# Patient Record
Sex: Female | Born: 1965 | Race: White | Hispanic: No | Marital: Married | Smoking: Former smoker
Health system: Southern US, Community
[De-identification: ages and names within clinical notes are randomized; demographics above are authoritative.]

## PROBLEM LIST (undated history)

## (undated) DIAGNOSIS — N898 Other specified noninflammatory disorders of vagina: Secondary | ICD-10-CM

## (undated) DIAGNOSIS — D649 Anemia, unspecified: Secondary | ICD-10-CM

## (undated) DIAGNOSIS — R5383 Other fatigue: Secondary | ICD-10-CM

## (undated) DIAGNOSIS — N920 Excessive and frequent menstruation with regular cycle: Secondary | ICD-10-CM

## (undated) DIAGNOSIS — N943 Premenstrual tension syndrome: Secondary | ICD-10-CM

## (undated) HISTORY — DX: Other fatigue: R53.83

## (undated) HISTORY — DX: Excessive and frequent menstruation with regular cycle: N92.0

## (undated) HISTORY — DX: Anemia, unspecified: D64.9

## (undated) HISTORY — DX: Other specified noninflammatory disorders of vagina: N89.8

## (undated) HISTORY — PX: OTHER SURGICAL HISTORY: SHX169

## (undated) HISTORY — PX: TONSILLECTOMY AND ADENOIDECTOMY: SHX28

## (undated) HISTORY — DX: Premenstrual tension syndrome: N94.3

---

## 2008-02-05 ENCOUNTER — Other Ambulatory Visit: Admission: RE | Admit: 2008-02-05 | Discharge: 2008-02-05 | Payer: Self-pay | Admitting: Obstetrics and Gynecology

## 2008-02-06 ENCOUNTER — Ambulatory Visit (HOSPITAL_COMMUNITY): Admission: RE | Admit: 2008-02-06 | Discharge: 2008-02-06 | Payer: Self-pay | Admitting: Obstetrics & Gynecology

## 2009-02-17 ENCOUNTER — Other Ambulatory Visit: Admission: RE | Admit: 2009-02-17 | Discharge: 2009-02-17 | Payer: Self-pay | Admitting: Obstetrics and Gynecology

## 2010-02-27 ENCOUNTER — Ambulatory Visit (HOSPITAL_COMMUNITY): Admission: RE | Admit: 2010-02-27 | Discharge: 2010-02-27 | Payer: Self-pay | Admitting: Obstetrics & Gynecology

## 2011-02-15 ENCOUNTER — Other Ambulatory Visit: Payer: Self-pay | Admitting: Obstetrics and Gynecology

## 2011-02-15 DIAGNOSIS — Z139 Encounter for screening, unspecified: Secondary | ICD-10-CM

## 2011-03-05 ENCOUNTER — Ambulatory Visit (HOSPITAL_COMMUNITY)
Admission: RE | Admit: 2011-03-05 | Discharge: 2011-03-05 | Disposition: A | Discharge status: Home / Self Care | Payer: PRIVATE HEALTH INSURANCE | Source: Ambulatory Visit | Attending: Obstetrics and Gynecology | Admitting: Obstetrics and Gynecology

## 2011-03-05 DIAGNOSIS — Z139 Encounter for screening, unspecified: Secondary | ICD-10-CM

## 2011-03-05 DIAGNOSIS — Z1231 Encounter for screening mammogram for malignant neoplasm of breast: Secondary | ICD-10-CM | POA: Insufficient documentation

## 2011-05-10 ENCOUNTER — Other Ambulatory Visit (HOSPITAL_COMMUNITY)
Admission: RE | Admit: 2011-05-10 | Discharge: 2011-05-10 | Disposition: A | Discharge status: Home / Self Care | Payer: PRIVATE HEALTH INSURANCE | Source: Ambulatory Visit | Attending: Obstetrics and Gynecology | Admitting: Obstetrics and Gynecology

## 2011-05-10 DIAGNOSIS — Z01419 Encounter for gynecological examination (general) (routine) without abnormal findings: Secondary | ICD-10-CM | POA: Insufficient documentation

## 2012-01-28 ENCOUNTER — Other Ambulatory Visit: Payer: Self-pay | Admitting: Obstetrics and Gynecology

## 2012-03-26 ENCOUNTER — Other Ambulatory Visit: Payer: Self-pay | Admitting: Adult Health

## 2012-03-26 DIAGNOSIS — Z139 Encounter for screening, unspecified: Secondary | ICD-10-CM

## 2012-03-31 ENCOUNTER — Ambulatory Visit (HOSPITAL_COMMUNITY)
Admission: RE | Admit: 2012-03-31 | Discharge: 2012-03-31 | Disposition: A | Discharge status: Home / Self Care | Payer: PRIVATE HEALTH INSURANCE | Source: Ambulatory Visit | Attending: Adult Health | Admitting: Adult Health

## 2012-03-31 DIAGNOSIS — Z1231 Encounter for screening mammogram for malignant neoplasm of breast: Secondary | ICD-10-CM | POA: Insufficient documentation

## 2012-03-31 DIAGNOSIS — Z139 Encounter for screening, unspecified: Secondary | ICD-10-CM

## 2012-05-26 ENCOUNTER — Other Ambulatory Visit (HOSPITAL_COMMUNITY)
Admission: RE | Admit: 2012-05-26 | Discharge: 2012-05-26 | Disposition: A | Discharge status: Home / Self Care | Payer: PRIVATE HEALTH INSURANCE | Source: Ambulatory Visit | Attending: Obstetrics and Gynecology | Admitting: Obstetrics and Gynecology

## 2012-05-26 DIAGNOSIS — Z01419 Encounter for gynecological examination (general) (routine) without abnormal findings: Secondary | ICD-10-CM | POA: Insufficient documentation

## 2012-05-26 DIAGNOSIS — Z1151 Encounter for screening for human papillomavirus (HPV): Secondary | ICD-10-CM | POA: Insufficient documentation

## 2013-06-01 ENCOUNTER — Encounter: Payer: Self-pay | Admitting: Adult Health

## 2013-06-01 ENCOUNTER — Ambulatory Visit (INDEPENDENT_AMBULATORY_CARE_PROVIDER_SITE_OTHER): Payer: BC Managed Care – PPO | Admitting: Adult Health

## 2013-06-01 VITALS — BP 110/66 | HR 74 | Ht 64.5 in | Wt 137.0 lb

## 2013-06-01 DIAGNOSIS — R5383 Other fatigue: Secondary | ICD-10-CM

## 2013-06-01 DIAGNOSIS — Z1212 Encounter for screening for malignant neoplasm of rectum: Secondary | ICD-10-CM

## 2013-06-01 DIAGNOSIS — N943 Premenstrual tension syndrome: Secondary | ICD-10-CM

## 2013-06-01 DIAGNOSIS — Z01419 Encounter for gynecological examination (general) (routine) without abnormal findings: Secondary | ICD-10-CM

## 2013-06-01 DIAGNOSIS — N898 Other specified noninflammatory disorders of vagina: Secondary | ICD-10-CM

## 2013-06-01 HISTORY — DX: Other fatigue: R53.83

## 2013-06-01 HISTORY — DX: Other specified noninflammatory disorders of vagina: N89.8

## 2013-06-01 HISTORY — DX: Premenstrual tension syndrome: N94.3

## 2013-06-01 LAB — CBC
HCT: 35.6 % — ABNORMAL LOW (ref 36.0–46.0)
Hemoglobin: 12.4 g/dL (ref 12.0–15.0)
MCH: 29.2 pg (ref 26.0–34.0)
MCHC: 34.8 g/dL (ref 30.0–36.0)
MCV: 83.8 fL (ref 78.0–100.0)
Platelets: 229 10*3/uL (ref 150–400)
RBC: 4.25 MIL/uL (ref 3.87–5.11)
RDW: 17.9 % — ABNORMAL HIGH (ref 11.5–15.5)
WBC: 7.6 10*3/uL (ref 4.0–10.5)

## 2013-06-01 LAB — HEMOCCULT GUIAC POC 1CARD (OFFICE): Fecal Occult Blood, POC: NEGATIVE

## 2013-06-01 LAB — POCT WET PREP (WET MOUNT): WBC, Wet Prep HPF POC: POSITIVE

## 2013-06-01 MED ORDER — FLUOXETINE HCL 20 MG PO TABS: 20.0000 mg | ORAL_TABLET | Freq: Every day | ORAL | Status: DC

## 2013-06-01 MED ORDER — NITROFURANTOIN MONOHYD MACRO 100 MG PO CAPS: 100.0000 mg | ORAL_CAPSULE | ORAL | Status: DC

## 2013-06-01 NOTE — Progress Notes (Signed)
Patient ID: Vanessa Beasley, female   DOB: July 17, 1966, 47 y.o.   MRN: [id number] History of Present Illness: Vanessa Beasley is a 47 year old white female married in for a physical and complaining of a vaginal discharge.No itching or burning, she said she is not so sure that husband may have cheated.She is complaining of being tired. Had pap last year.  Current Medications, Allergies, Past Medical History, Past Surgical History, Family History and Social History were reviewed in [employer] record.     Review of Systems: Patient denies any headaches, blurred vision, shortness of breath, chest pain, abdominal pain, problems with bowel movements, urination, or intercourse. No joint pain or mood swings, periods are lighter, no hot flashes or nite sweats.She takes Prozac for PMS and it works well.    Physical Exam:BP 110/66  Pulse 74  Ht 5' 4.5" (1.638 m)  Wt 137 lb (62.143 kg)  BMI 23.16 kg/m2  LMP 05/28/2013 General:  Well developed, well nourished, no acute distress Skin:  Warm and dry Neck:  Midline trachea, normal thyroid Lungs; Clear to auscultation bilaterally Breast:  No dominant palpable mass, retraction, or nipple discharge Cardiovascular: Regular rate and rhythm Abdomen:  Soft, non tender, no hepatosplenomegaly Pelvic:  External genitalia is normal in appearance.  The vagina is normal in appearance, light blood in vault.  The cervix is bulbous.  Uterus is felt to be normal size, shape, and contour.  No                adnexal masses or tenderness noted.wet prep was positive WBCs and GC/CHL obtained. Rectal: Good sphincter tone, no polyps, or hemorrhoids felt.  Hemoccult negative. Extremities:  No swelling or varicosities noted Psych:  Alert and cooperative   Impression: Yearly exam-no pap Vaginal discharge PMS Fatigue     Plan: Check CBC,CMP,TSH and lipids GC/CHL sent Refilled prozac 20 mg #90 1 daily with 4 refills Refilled macrobid 100 mg 1 after sex #30  with 3 refills Physical in 1 year Mammogram yearly

## 2013-06-01 NOTE — Patient Instructions (Addendum)
Mammogram yearly Physical in 1 year Follow labs in 48 hours

## 2013-06-02 ENCOUNTER — Other Ambulatory Visit: Payer: Self-pay | Admitting: Adult Health

## 2013-06-02 DIAGNOSIS — Z139 Encounter for screening, unspecified: Secondary | ICD-10-CM

## 2013-06-02 LAB — COMPREHENSIVE METABOLIC PANEL
ALT: 13 U/L (ref 0–35)
AST: 16 U/L (ref 0–37)
Albumin: 4.4 g/dL (ref 3.5–5.2)
Alkaline Phosphatase: 88 U/L (ref 39–117)
BUN: 6 mg/dL (ref 6–23)
CO2: 27 mEq/L (ref 19–32)
Calcium: 9.6 mg/dL (ref 8.4–10.5)
Chloride: 103 mEq/L (ref 96–112)
Creat: 0.68 mg/dL (ref 0.50–1.10)
Glucose, Bld: 85 mg/dL (ref 70–99)
Potassium: 4.4 mEq/L (ref 3.5–5.3)
Sodium: 139 mEq/L (ref 135–145)
Total Bilirubin: 0.4 mg/dL (ref 0.3–1.2)
Total Protein: 7.6 g/dL (ref 6.0–8.3)

## 2013-06-02 LAB — LIPID PANEL
Cholesterol: 162 mg/dL (ref 0–200)
HDL: 40 mg/dL (ref >39)
LDL Cholesterol: 88 mg/dL (ref 0–99)
Total CHOL/HDL Ratio: 4.1 Ratio
Triglycerides: 171 mg/dL — ABNORMAL HIGH (ref <150)
VLDL: 34 mg/dL (ref 0–40)

## 2013-06-02 LAB — TSH: TSH: 2.584 u[IU]/mL (ref 0.350–4.500)

## 2013-06-02 LAB — GC/CHLAMYDIA PROBE AMP
CT Probe RNA: NEGATIVE
GC Probe RNA: NEGATIVE

## 2013-06-03 ENCOUNTER — Telehealth: Payer: Self-pay | Admitting: Adult Health

## 2013-06-03 NOTE — Telephone Encounter (Signed)
Left message regarding labs and ned to increase activity and take krell oil

## 2013-06-04 ENCOUNTER — Ambulatory Visit (HOSPITAL_COMMUNITY)
Admission: RE | Admit: 2013-06-04 | Discharge: 2013-06-04 | Disposition: A | Discharge status: Home / Self Care | Payer: BC Managed Care – PPO | Source: Ambulatory Visit | Attending: Adult Health | Admitting: Adult Health

## 2013-06-04 DIAGNOSIS — Z139 Encounter for screening, unspecified: Secondary | ICD-10-CM

## 2013-06-04 DIAGNOSIS — Z1231 Encounter for screening mammogram for malignant neoplasm of breast: Secondary | ICD-10-CM | POA: Insufficient documentation

## 2013-08-27 ENCOUNTER — Other Ambulatory Visit: Payer: Self-pay

## 2014-06-09 ENCOUNTER — Ambulatory Visit (INDEPENDENT_AMBULATORY_CARE_PROVIDER_SITE_OTHER): Payer: BC Managed Care – PPO | Admitting: Adult Health

## 2014-06-09 ENCOUNTER — Other Ambulatory Visit: Payer: Self-pay | Admitting: Adult Health

## 2014-06-09 ENCOUNTER — Encounter: Payer: Self-pay | Admitting: Adult Health

## 2014-06-09 VITALS — BP 110/68 | HR 74 | Ht 65.0 in | Wt 137.0 lb

## 2014-06-09 DIAGNOSIS — Z1231 Encounter for screening mammogram for malignant neoplasm of breast: Secondary | ICD-10-CM

## 2014-06-09 DIAGNOSIS — Z1212 Encounter for screening for malignant neoplasm of rectum: Secondary | ICD-10-CM

## 2014-06-09 DIAGNOSIS — N943 Premenstrual tension syndrome: Secondary | ICD-10-CM

## 2014-06-09 DIAGNOSIS — Z01419 Encounter for gynecological examination (general) (routine) without abnormal findings: Secondary | ICD-10-CM

## 2014-06-09 LAB — CBC
HCT: 27.7 % — ABNORMAL LOW (ref 36.0–46.0)
Hemoglobin: 8.9 g/dL — ABNORMAL LOW (ref 12.0–15.0)
MCH: 22.8 pg — ABNORMAL LOW (ref 26.0–34.0)
MCHC: 32.1 g/dL (ref 30.0–36.0)
MCV: 71 fL — ABNORMAL LOW (ref 78.0–100.0)
Platelets: 255 10*3/uL (ref 150–400)
RBC: 3.9 MIL/uL (ref 3.87–5.11)
RDW: 16.6 % — ABNORMAL HIGH (ref 11.5–15.5)
WBC: 5.3 10*3/uL (ref 4.0–10.5)

## 2014-06-09 LAB — HEMOCCULT GUIAC POC 1CARD (OFFICE): Fecal Occult Blood, POC: NEGATIVE

## 2014-06-09 MED ORDER — FLUOXETINE HCL 20 MG PO TABS: 20.0000 mg | ORAL_TABLET | Freq: Every day | ORAL | Status: DC

## 2014-06-09 MED ORDER — NITROFURANTOIN MONOHYD MACRO 100 MG PO CAPS: 100.0000 mg | ORAL_CAPSULE | ORAL | Status: DC

## 2014-06-09 NOTE — Patient Instructions (Signed)
Physical in 1 year with pap Mammogram yearly  Colonoscopy at 51  Aqua glycolic for face

## 2014-06-09 NOTE — Progress Notes (Signed)
Patient ID: Laureen Frederic, female   DOB: 1966/02/23, 48 y.o.   MRN: [id number] History of Present Illness: Poet is a 48 year old white female, in for a physical.She had a normal pap with negative HPV 05/26/12. No complaints.  Current Medications, Allergies, Past Medical History, Past Surgical History, Family History and Social History were reviewed in [employer] record.     Review of Systems: Patient denies any headaches, blurred vision, shortness of breath, chest pain, abdominal pain, problems with bowel movements, urination, or intercourse. No joint pain or mood swings.Did see some blood after hard BM, none now.Her prozac is working for her PMS and the Shaniko after sex is working well too.Did mention clogged pores on face at times.Periods regular, no hot flashes yet, we did talk about peri menopause symptoms and irregular periods.     Physical Exam:BP 110/68  Pulse 74  Ht 5\' 5"  (1.651 m)  Wt 137 lb (62.143 kg)  BMI 22.80 kg/m2  LMP 05/31/2014 General:  Well developed, well nourished, no acute distress Skin:  Warm and dry Neck:  Midline trachea, normal thyroid Lungs; Clear to auscultation bilaterally Breast:  No dominant palpable mass, retraction, or nipple discharge Cardiovascular: Regular rate and rhythm Abdomen:  Soft, non tender, no hepatosplenomegaly Pelvic:  External genitalia is normal in appearance.  The vagina is normal in appearance.  The cervix is bulbous.  Uterus is felt to be normal size, shape, and contour.  No  adnexal masses or tenderness noted. Rectal: Good sphincter tone, no polyps, or hemorrhoids felt.  Hemoccult negative. Extremities:  No swelling or varicosities noted Psych:  No mood changes, alert and cooperative,seems happy   Impression: Yearly gyn exam no pap PMS   Plan: Physical and pap in 1 year Mammogram yearly  Colonoscopy at 51 Refilled prozac 20 mg #90 1 daily with 4 refills Refilled macrobid #30 1 after sex prn with 3  refills Try aqua glycolic for face Check CBC,CMP,TSH and lipids Try prune juice daily for softer stools and increase water

## 2014-06-10 ENCOUNTER — Telehealth: Payer: Self-pay | Admitting: Adult Health

## 2014-06-10 ENCOUNTER — Ambulatory Visit (HOSPITAL_COMMUNITY)
Admission: RE | Admit: 2014-06-10 | Discharge: 2014-06-10 | Disposition: A | Discharge status: Home / Self Care | Payer: BC Managed Care – PPO | Source: Ambulatory Visit | Attending: Adult Health | Admitting: Adult Health

## 2014-06-10 DIAGNOSIS — Z1231 Encounter for screening mammogram for malignant neoplasm of breast: Secondary | ICD-10-CM | POA: Insufficient documentation

## 2014-06-10 LAB — COMPREHENSIVE METABOLIC PANEL
ALT: 10 U/L (ref 0–35)
AST: 14 U/L (ref 0–37)
Albumin: 4.2 g/dL (ref 3.5–5.2)
Alkaline Phosphatase: 89 U/L (ref 39–117)
BUN: 10 mg/dL (ref 6–23)
CO2: 26 mEq/L (ref 19–32)
Calcium: 8.8 mg/dL (ref 8.4–10.5)
Chloride: 105 mEq/L (ref 96–112)
Creat: 0.71 mg/dL (ref 0.50–1.10)
Glucose, Bld: 94 mg/dL (ref 70–99)
Potassium: 4.5 mEq/L (ref 3.5–5.3)
Sodium: 138 mEq/L (ref 135–145)
Total Bilirubin: 0.4 mg/dL (ref 0.2–1.2)
Total Protein: 7.6 g/dL (ref 6.0–8.3)

## 2014-06-10 LAB — LIPID PANEL
Cholesterol: 172 mg/dL (ref 0–200)
HDL: 45 mg/dL (ref >39)
LDL Cholesterol: 97 mg/dL (ref 0–99)
Total CHOL/HDL Ratio: 3.8 Ratio
Triglycerides: 149 mg/dL (ref <150)
VLDL: 30 mg/dL (ref 0–40)

## 2014-06-10 LAB — TSH: TSH: 1.893 u[IU]/mL (ref 0.350–4.500)

## 2014-06-10 NOTE — Telephone Encounter (Signed)
Pt aware of labs and that she is anemic,hgb 8.9, take MV with fe and OTC iron 325 mg, has had some heavy periods maybe 3 out of 5, will get [place] in office and see me, if US WNL, may try megace to stop periods and recheck CBC, if anemia persists, will get GI consult.

## 2014-06-18 ENCOUNTER — Other Ambulatory Visit: Payer: Self-pay | Admitting: Adult Health

## 2014-06-18 DIAGNOSIS — N920 Excessive and frequent menstruation with regular cycle: Secondary | ICD-10-CM

## 2014-06-23 ENCOUNTER — Ambulatory Visit (INDEPENDENT_AMBULATORY_CARE_PROVIDER_SITE_OTHER): Payer: BC Managed Care – PPO

## 2014-06-23 ENCOUNTER — Ambulatory Visit (INDEPENDENT_AMBULATORY_CARE_PROVIDER_SITE_OTHER): Payer: BC Managed Care – PPO | Admitting: Adult Health

## 2014-06-23 ENCOUNTER — Encounter: Payer: Self-pay | Admitting: Adult Health

## 2014-06-23 VITALS — BP 100/68 | Ht 65.0 in | Wt 141.0 lb

## 2014-06-23 DIAGNOSIS — D509 Iron deficiency anemia, unspecified: Secondary | ICD-10-CM

## 2014-06-23 DIAGNOSIS — N920 Excessive and frequent menstruation with regular cycle: Secondary | ICD-10-CM

## 2014-06-23 DIAGNOSIS — D649 Anemia, unspecified: Secondary | ICD-10-CM

## 2014-06-23 HISTORY — DX: Excessive and frequent menstruation with regular cycle: N92.0

## 2014-06-23 HISTORY — DX: Anemia, unspecified: D64.9

## 2014-06-23 MED ORDER — MEGESTROL ACETATE 40 MG PO TABS: ORAL_TABLET | ORAL | Status: DC

## 2014-06-23 NOTE — Patient Instructions (Signed)

## 2014-06-23 NOTE — Progress Notes (Signed)
Subjective:     Patient ID: Vanessa Beasley, female   DOB: 18-Dec-1965, 48 y.o.   MRN: [id number]  HPI Vanessa Beasley is a 48 year old white female in for [place] for menorrhagia and anemia.  Review of Systems See HPI Reviewed past medical,surgical, social and family history. Reviewed medications and allergies.     Objective:   Physical Exam BP 100/68  Ht 5\' 5"  (1.651 m)  Wt 141 lb (63.957 kg)  BMI 23.46 kg/m2  LMP 08/10/2015Reviewed [place] with pt.   Uterus 10.3 x 7.1 x 5.8 cm, anteverted no myometrial masses noted  Endometrium 13.7 mm, symmetrical,  Right ovary 2.8 x 1.9 x 1.9 cm,  Left ovary 3.4 x 2.3 x 2.0 cm,  No free fluid or adnexal masses noted within the pelvis  Technician Comments:  Anteverted uterus, Endom-13.66mm, bilateral adnexa/ovaries appears WNL, no free fluid or adnexal masses noted within the pelvis, discussed with Dr Glo Herring.Discussed megace and ablation with pt. Will try megace to stop periods and recheck CBC in 6 weeks   Assessment:     Menorrhagia Anemia    Plan:     Rx megace 40 mg #45 3 x 5 days then 2 x 5 days then 1 daily with 1 refill Continue Iron tablets Review handout on menorrhagia and endo ablation Follow up in 6 weeks for ros and check CBC

## 2014-08-04 ENCOUNTER — Encounter: Payer: Self-pay | Admitting: Adult Health

## 2014-08-04 ENCOUNTER — Ambulatory Visit (INDEPENDENT_AMBULATORY_CARE_PROVIDER_SITE_OTHER): Payer: BC Managed Care – PPO | Admitting: Adult Health

## 2014-08-04 VITALS — BP 128/80 | Ht 65.0 in | Wt 138.5 lb

## 2014-08-04 DIAGNOSIS — N920 Excessive and frequent menstruation with regular cycle: Secondary | ICD-10-CM

## 2014-08-04 DIAGNOSIS — D509 Iron deficiency anemia, unspecified: Secondary | ICD-10-CM

## 2014-08-04 LAB — CBC
HCT: 38.5 % (ref 36.0–46.0)
Hemoglobin: 12.7 g/dL (ref 12.0–15.0)
MCH: 27.2 pg (ref 26.0–34.0)
MCHC: 33 g/dL (ref 30.0–36.0)
MCV: 82.4 fL (ref 78.0–100.0)
Platelets: 217 10*3/uL (ref 150–400)
RBC: 4.67 MIL/uL (ref 3.87–5.11)
RDW: 22.1 % — ABNORMAL HIGH (ref 11.5–15.5)
WBC: 6.5 10*3/uL (ref 4.0–10.5)

## 2014-08-04 MED ORDER — MEGESTROL ACETATE 40 MG PO TABS: ORAL_TABLET | ORAL | Status: DC

## 2014-08-04 NOTE — Progress Notes (Addendum)
Subjective:     Patient ID: Vanessa Beasley, female   DOB: 1966-09-26, 48 y.o.   MRN: [id number]  HPI Vanessa Beasley is a 48 year old white female, back in follow up of menorrhagia and anemia, she is feeling much better and bleeding had stopped but spotting for 2 weeks now,is not craving ice as was before.  Review of Systems See HPI Reviewed past medical,surgical, social and family history. Reviewed medications and allergies.     Objective:   Physical Exam BP 128/80  Ht 5\' 5"  (1.651 m)  Wt 138 lb 8 oz (62.823 kg)  BMI 23.05 kg/m2   Discussed continuing megace and if bleeding returned heavy could restart with 3 a day for 5 days and then 2 a day for 5 days then 1 daily, is not interested in ablation at this time. She got flu shot today at work.  Assessment:     Menorrhagia,resolved with megace Anemia     Plan:    CBC Continue megace refilled it x 3  Continue iron Follow up in 3 months   Call prn

## 2014-08-04 NOTE — Patient Instructions (Signed)
Continue megace  Continue iron Return in 3 months

## 2014-08-05 ENCOUNTER — Telehealth: Payer: Self-pay | Admitting: Adult Health

## 2014-08-05 NOTE — Telephone Encounter (Signed)
Pt aware of labs, feels better some night sweats

## 2014-08-23 ENCOUNTER — Encounter: Payer: Self-pay | Admitting: Adult Health

## 2014-11-05 ENCOUNTER — Ambulatory Visit (INDEPENDENT_AMBULATORY_CARE_PROVIDER_SITE_OTHER): Payer: BLUE CROSS/BLUE SHIELD | Admitting: Adult Health

## 2014-11-05 ENCOUNTER — Encounter: Payer: Self-pay | Admitting: Adult Health

## 2014-11-05 VITALS — BP 104/70 | Ht 65.0 in | Wt 144.0 lb

## 2014-11-05 DIAGNOSIS — D509 Iron deficiency anemia, unspecified: Secondary | ICD-10-CM

## 2014-11-05 DIAGNOSIS — N920 Excessive and frequent menstruation with regular cycle: Secondary | ICD-10-CM

## 2014-11-05 LAB — CBC
HCT: 38 % (ref 36.0–46.0)
Hemoglobin: 12.8 g/dL (ref 12.0–15.0)
MCH: 30.5 pg (ref 26.0–34.0)
MCHC: 33.7 g/dL (ref 30.0–36.0)
MCV: 90.7 fL (ref 78.0–100.0)
MPV: 9.3 fL (ref 8.6–12.4)
Platelets: 291 10*3/uL (ref 150–400)
RBC: 4.19 MIL/uL (ref 3.87–5.11)
RDW: 13.4 % (ref 11.5–15.5)
WBC: 5.9 10*3/uL (ref 4.0–10.5)

## 2014-11-05 NOTE — Progress Notes (Signed)
Subjective:     Patient ID: Vanessa Beasley, female   DOB: July 09, 1966, 49 y.o.   MRN: [id number]  HPI Vanessa Beasley is a 49 year old white female back in follow up of menorrhagia and anemia.Stopped megace about 2 months ago, periods heavy but taking iron and feels good.Does not want to take megace,declines IUD or ablation at this time if iron helps.  Review of Systems See HPI Reviewed past medical,surgical, social and family history. Reviewed medications and allergies.     Objective:   Physical Exam BP 104/70 mmHg  Ht 5\' 5"  (1.651 m)  Wt 144 lb (65.318 kg)  BMI 23.96 kg/m2  LMP 11/01/2014   Talk only,see HPI, will check CBC and be supportive of her.  Assessment:    Menorrhagia Anemia    Plan:     Continue iron Check CBC Follow up in 3 months

## 2014-11-05 NOTE — Patient Instructions (Signed)
Continue iron  Follow up in 3 months

## 2014-11-08 ENCOUNTER — Telehealth: Payer: Self-pay | Admitting: Adult Health

## 2014-11-08 NOTE — Telephone Encounter (Signed)
Pt aware HGB 12.8 will follow for now, keep taking iron

## 2015-02-09 ENCOUNTER — Ambulatory Visit: Payer: BLUE CROSS/BLUE SHIELD | Admitting: Adult Health

## 2015-06-10 ENCOUNTER — Encounter: Payer: Self-pay | Admitting: Adult Health

## 2015-06-10 ENCOUNTER — Other Ambulatory Visit (HOSPITAL_COMMUNITY)
Admission: RE | Admit: 2015-06-10 | Discharge: 2015-06-10 | Disposition: A | Discharge status: Home / Self Care | Payer: BLUE CROSS/BLUE SHIELD | Source: Ambulatory Visit | Attending: Adult Health | Admitting: Adult Health

## 2015-06-10 ENCOUNTER — Ambulatory Visit (INDEPENDENT_AMBULATORY_CARE_PROVIDER_SITE_OTHER): Payer: BLUE CROSS/BLUE SHIELD | Admitting: Adult Health

## 2015-06-10 VITALS — BP 110/80 | HR 64 | Ht 64.5 in | Wt 147.0 lb

## 2015-06-10 DIAGNOSIS — Z1151 Encounter for screening for human papillomavirus (HPV): Secondary | ICD-10-CM | POA: Insufficient documentation

## 2015-06-10 DIAGNOSIS — Z124 Encounter for screening for malignant neoplasm of cervix: Secondary | ICD-10-CM | POA: Diagnosis not present

## 2015-06-10 DIAGNOSIS — Z01419 Encounter for gynecological examination (general) (routine) without abnormal findings: Secondary | ICD-10-CM

## 2015-06-10 DIAGNOSIS — N943 Premenstrual tension syndrome: Secondary | ICD-10-CM

## 2015-06-10 DIAGNOSIS — N924 Excessive bleeding in the premenopausal period: Secondary | ICD-10-CM

## 2015-06-10 LAB — HEMOCCULT GUIAC POC 1CARD (OFFICE): Fecal Occult Blood, POC: NEGATIVE

## 2015-06-10 MED ORDER — FLUOXETINE HCL 20 MG PO TABS: 20.0000 mg | ORAL_TABLET | Freq: Every day | ORAL | Status: DC

## 2015-06-10 MED ORDER — NITROFURANTOIN MONOHYD MACRO 100 MG PO CAPS: 100.0000 mg | ORAL_CAPSULE | ORAL | Status: DC

## 2015-06-10 NOTE — Progress Notes (Signed)
Patient ID: Vanessa Beasley, female   DOB: 06-Aug-1966, 49 y.o.   MRN: [id number] History of Present Illness: Vanessa Beasley is a 49 year old white female, married, in for well woman gyn exam and pap.   Current Medications, Allergies, Past Medical History, Past Surgical History, Family History and Social History were reviewed in [employer] record.     Review of Systems: Patient denies any headaches, hearing loss, fatigue, blurred vision, shortness of breath, chest pain, abdominal pain, problems with bowel movements, urination, or intercourse. No joint pain or mood swings.She is still having heavy periods, and they are regular, but wants to hold out for menopause,declines megace at present or surgical intervention, she is taking iron and prozac has helped with PMS and taking macrobid after sex, has kept her from getting UTIs.    Physical Exam:BP 110/80 mmHg  Pulse 64  Ht 5' 4.5" (1.638 m)  Wt 147 lb (66.679 kg)  BMI 24.85 kg/m2  LMP 05/30/2015 General:  Well developed, well nourished, no acute distress Skin:  Warm and dry Neck:  Midline trachea, normal thyroid, good ROM, no lymphadenopathy Lungs; Clear to auscultation bilaterally Breast:  No dominant palpable mass, retraction, or nipple discharge Cardiovascular: Regular rate and rhythm Abdomen:  Soft, non tender, no hepatosplenomegaly Pelvic:  External genitalia is normal in appearance, no lesions.  The vagina is normal in appearance. Urethra has no lesions or masses. The cervix is bulbous.Pap with HPV performed.  Uterus is felt to be normal size, shape, and contour.  No adnexal masses or tenderness noted.Bladder is non tender, no masses felt. Rectal: Good sphincter tone, no polyps, or hemorrhoids felt.  Hemoccult negative. Extremities/musculoskeletal:  No swelling or varicosities noted, no clubbing or cyanosis Psych:  No mood changes, alert and cooperative,seems happy   Impression:  Well woman gyn exam with pap Menorrhagia   PMS   Plan: Check CBC,CMP,TSH and lipids Get mammogram now and yearly Physical in 1 year,pap in 3 if normal with negative HPV Refilled macro bid #30 1 after sex with 3 refills Refilled prozac 20 mg #90 1 daily with 3 refills Continue Iron, will talk when labs back

## 2015-06-10 NOTE — Patient Instructions (Signed)
Get mammogram  Physical in 1 year  

## 2015-06-11 LAB — TSH: TSH: 3.15 u[IU]/mL (ref 0.450–4.500)

## 2015-06-11 LAB — COMPREHENSIVE METABOLIC PANEL
ALT: 32 IU/L (ref 0–32)
AST: 24 IU/L (ref 0–40)
Albumin/Globulin Ratio: 1.4 (ref 1.1–2.5)
Albumin: 4.2 g/dL (ref 3.5–5.5)
Alkaline Phosphatase: 91 IU/L (ref 39–117)
BUN/Creatinine Ratio: 11 (ref 9–23)
BUN: 8 mg/dL (ref 6–24)
Bilirubin Total: 0.3 mg/dL (ref 0.0–1.2)
CO2: 24 mmol/L (ref 18–29)
Calcium: 9.2 mg/dL (ref 8.7–10.2)
Chloride: 101 mmol/L (ref 97–108)
Creatinine, Ser: 0.76 mg/dL (ref 0.57–1.00)
GFR calc Af Amer: 107 mL/min/{1.73_m2} (ref >59)
GFR calc non Af Amer: 93 mL/min/{1.73_m2} (ref >59)
Globulin, Total: 3 g/dL (ref 1.5–4.5)
Glucose: 89 mg/dL (ref 65–99)
Potassium: 4.5 mmol/L (ref 3.5–5.2)
Sodium: 138 mmol/L (ref 134–144)
Total Protein: 7.2 g/dL (ref 6.0–8.5)

## 2015-06-11 LAB — CBC
Hematocrit: 40.1 % (ref 34.0–46.6)
Hemoglobin: 13.2 g/dL (ref 11.1–15.9)
MCH: 30 pg (ref 26.6–33.0)
MCHC: 32.9 g/dL (ref 31.5–35.7)
MCV: 91 fL (ref 79–97)
Platelets: 245 10*3/uL (ref 150–379)
RBC: 4.4 x10E6/uL (ref 3.77–5.28)
RDW: 13.3 % (ref 12.3–15.4)
WBC: 7.7 10*3/uL (ref 3.4–10.8)

## 2015-06-11 LAB — LIPID PANEL
Chol/HDL Ratio: 5.1 ratio units — ABNORMAL HIGH (ref 0.0–4.4)
Cholesterol, Total: 209 mg/dL — ABNORMAL HIGH (ref 100–199)
HDL: 41 mg/dL (ref >39)
LDL Calculated: 115 mg/dL — ABNORMAL HIGH (ref 0–99)
Triglycerides: 267 mg/dL — ABNORMAL HIGH (ref 0–149)
VLDL Cholesterol Cal: 53 mg/dL — ABNORMAL HIGH (ref 5–40)

## 2015-06-13 ENCOUNTER — Telehealth: Payer: Self-pay | Admitting: Adult Health

## 2015-06-13 LAB — CYTOLOGY - PAP

## 2015-06-13 NOTE — Telephone Encounter (Signed)
Pt aware of labs and need to increase exercise and watch diet and will recheck labs next year

## 2015-06-24 ENCOUNTER — Other Ambulatory Visit: Payer: Self-pay | Admitting: Adult Health

## 2015-06-24 DIAGNOSIS — Z1231 Encounter for screening mammogram for malignant neoplasm of breast: Secondary | ICD-10-CM

## 2015-07-01 ENCOUNTER — Ambulatory Visit (HOSPITAL_COMMUNITY)
Admission: RE | Admit: 2015-07-01 | Discharge: 2015-07-01 | Disposition: A | Discharge status: Home / Self Care | Payer: BLUE CROSS/BLUE SHIELD | Source: Ambulatory Visit | Attending: Adult Health | Admitting: Adult Health

## 2015-07-01 DIAGNOSIS — Z1231 Encounter for screening mammogram for malignant neoplasm of breast: Secondary | ICD-10-CM | POA: Diagnosis not present

## 2016-05-22 ENCOUNTER — Other Ambulatory Visit: Payer: Self-pay | Admitting: Adult Health

## 2016-05-22 DIAGNOSIS — Z1231 Encounter for screening mammogram for malignant neoplasm of breast: Secondary | ICD-10-CM

## 2016-06-11 ENCOUNTER — Encounter: Payer: Self-pay | Admitting: Adult Health

## 2016-06-11 ENCOUNTER — Ambulatory Visit (INDEPENDENT_AMBULATORY_CARE_PROVIDER_SITE_OTHER): Payer: BLUE CROSS/BLUE SHIELD | Admitting: Adult Health

## 2016-06-11 VITALS — BP 132/60 | HR 70 | Ht 64.5 in | Wt 133.0 lb

## 2016-06-11 DIAGNOSIS — N943 Premenstrual tension syndrome: Secondary | ICD-10-CM | POA: Diagnosis not present

## 2016-06-11 DIAGNOSIS — Z01411 Encounter for gynecological examination (general) (routine) with abnormal findings: Secondary | ICD-10-CM | POA: Diagnosis not present

## 2016-06-11 DIAGNOSIS — J0120 Acute ethmoidal sinusitis, unspecified: Secondary | ICD-10-CM | POA: Diagnosis not present

## 2016-06-11 DIAGNOSIS — Z01419 Encounter for gynecological examination (general) (routine) without abnormal findings: Secondary | ICD-10-CM

## 2016-06-11 DIAGNOSIS — Z1211 Encounter for screening for malignant neoplasm of colon: Secondary | ICD-10-CM | POA: Diagnosis not present

## 2016-06-11 LAB — HEMOCCULT GUIAC POC 1CARD (OFFICE): Fecal Occult Blood, POC: NEGATIVE

## 2016-06-11 MED ORDER — AZITHROMYCIN 250 MG PO TABS: ORAL_TABLET | ORAL | 0 refills | Status: DC

## 2016-06-11 MED ORDER — NITROFURANTOIN MONOHYD MACRO 100 MG PO CAPS: 100.0000 mg | ORAL_CAPSULE | ORAL | 3 refills | Status: DC

## 2016-06-11 MED ORDER — FLUOXETINE HCL 20 MG PO TABS: 20.0000 mg | ORAL_TABLET | Freq: Every day | ORAL | 4 refills | Status: DC

## 2016-06-11 NOTE — Progress Notes (Signed)
Patient ID: Vanessa Beasley, female   DOB: May 31, 1966, 50 y.o.   MRN: [id number] History of Present Illness: Vanessa Beasley is a 50 year old white female in for a well woman gyn exam and pap, she had a normal pap with negative HPV 06/10/15.She is having some sinus pressure and headache for about 2 weeks and has lots of snot in am, is taking Claritin.    Current Medications, Allergies, Past Medical History, Past Surgical History, Family History and Social History were reviewed in [employer] record.     Review of Systems: Patient denies any hearing loss, fatigue, blurred vision, shortness of breath, chest pain, abdominal pain, problems with bowel movements, urination, or intercourse. No joint pain,still moody at times, esp before period, + sinus pressure and headaches for 2 weeks.Still having periods every month.    Physical Exam:BP 132/60 (BP Location: Left Arm, Patient Position: Sitting, Cuff Size: Normal)   Pulse 70   Ht 5' 4.5" (1.638 m)   Wt 133 lb (60.3 kg)   LMP 06/01/2016 (Exact Date)   BMI 22.48 kg/m  General:  Well developed, well nourished, no acute distress Skin:  Warm and dry,+ehtmoid sinus tenderness, and some maxillary discomfort  Neck:  Midline trachea, normal thyroid, good ROM, no lymphadenopathy Lungs; Clear to auscultation bilaterally Breast:  No dominant palpable mass, retraction, or nipple discharge Cardiovascular: Regular rate and rhythm Abdomen:  Soft, non tender, no hepatosplenomegaly Pelvic:  External genitalia is normal in appearance, no lesions.  The vagina is normal in appearance. Urethra has no lesions or masses. The cervix is bulbous.  Uterus is felt to be normal size, shape, and contour.  No adnexal masses or tenderness noted.Bladder is non tender, no masses felt. Rectal: Good sphincter tone, no polyps, or hemorrhoids felt.  Hemoccult negative. Extremities/musculoskeletal:  No swelling or varicosities noted, no clubbing or cyanosis Psych:  No mood  changes, alert and cooperative,seems happy   Impression: Well woman gyn exam no pap Ethmoid sinus infection PMS    Plan: Check CBC,CMP,TSH and lipids,A1c and vitamin D Refilled prozac 20 mg #90 take 1 daily with 4 refills Refilled macrobid #30 take 1 after sex #30 with 3 refills  Physical in 1 year, pap 2019 Mammogram yearly Colonoscopy at 31

## 2016-06-11 NOTE — Patient Instructions (Signed)
Increase fluids Physical in 1 year pap 2019  Mammogram yearly Colonoscopy at 25

## 2016-06-12 ENCOUNTER — Telehealth: Payer: Self-pay | Admitting: Adult Health

## 2016-06-12 LAB — CBC
Hematocrit: 38.4 % (ref 34.0–46.6)
Hemoglobin: 13.3 g/dL (ref 11.1–15.9)
MCH: 31.7 pg (ref 26.6–33.0)
MCHC: 34.6 g/dL (ref 31.5–35.7)
MCV: 91 fL (ref 79–97)
Platelets: 243 10*3/uL (ref 150–379)
RBC: 4.2 x10E6/uL (ref 3.77–5.28)
RDW: 12.9 % (ref 12.3–15.4)
WBC: 9 10*3/uL (ref 3.4–10.8)

## 2016-06-12 LAB — COMPREHENSIVE METABOLIC PANEL
ALT: 17 IU/L (ref 0–32)
AST: 18 IU/L (ref 0–40)
Albumin/Globulin Ratio: 1.4 (ref 1.2–2.2)
Albumin: 4.3 g/dL (ref 3.5–5.5)
Alkaline Phosphatase: 87 IU/L (ref 39–117)
BUN/Creatinine Ratio: 10 (ref 9–23)
BUN: 8 mg/dL (ref 6–24)
Bilirubin Total: 0.4 mg/dL (ref 0.0–1.2)
CO2: 27 mmol/L (ref 18–29)
Calcium: 9.3 mg/dL (ref 8.7–10.2)
Chloride: 100 mmol/L (ref 96–106)
Creatinine, Ser: 0.78 mg/dL (ref 0.57–1.00)
GFR calc Af Amer: 103 mL/min/{1.73_m2} (ref >59)
GFR calc non Af Amer: 90 mL/min/{1.73_m2} (ref >59)
Globulin, Total: 3 g/dL (ref 1.5–4.5)
Glucose: 85 mg/dL (ref 65–99)
Potassium: 4.6 mmol/L (ref 3.5–5.2)
Sodium: 138 mmol/L (ref 134–144)
Total Protein: 7.3 g/dL (ref 6.0–8.5)

## 2016-06-12 LAB — LIPID PANEL
Chol/HDL Ratio: 3.4 ratio units (ref 0.0–4.4)
Cholesterol, Total: 178 mg/dL (ref 100–199)
HDL: 53 mg/dL (ref >39)
LDL Calculated: 99 mg/dL (ref 0–99)
Triglycerides: 129 mg/dL (ref 0–149)
VLDL Cholesterol Cal: 26 mg/dL (ref 5–40)

## 2016-06-12 LAB — HEMOGLOBIN A1C
Est. average glucose Bld gHb Est-mCnc: 94 mg/dL
Hgb A1c MFr Bld: 4.9 % (ref 4.8–5.6)

## 2016-06-12 LAB — TSH: TSH: 2.9 u[IU]/mL (ref 0.450–4.500)

## 2016-06-12 LAB — VITAMIN D 25 HYDROXY (VIT D DEFICIENCY, FRACTURES): Vit D, 25-Hydroxy: 26.8 ng/mL — ABNORMAL LOW (ref 30.0–100.0)

## 2016-06-12 NOTE — Telephone Encounter (Signed)
Pt aware labs look good but vitamin D a little low, take 5000 IU vitamin D 3 every day for about a month then 2000 IU Daily

## 2016-06-17 ENCOUNTER — Other Ambulatory Visit: Payer: Self-pay | Admitting: Adult Health

## 2016-07-02 ENCOUNTER — Ambulatory Visit (HOSPITAL_COMMUNITY)
Admission: RE | Admit: 2016-07-02 | Discharge: 2016-07-02 | Disposition: A | Discharge status: Home / Self Care | Payer: BLUE CROSS/BLUE SHIELD | Source: Ambulatory Visit | Attending: Adult Health | Admitting: Adult Health

## 2016-07-02 DIAGNOSIS — Z1231 Encounter for screening mammogram for malignant neoplasm of breast: Secondary | ICD-10-CM | POA: Diagnosis not present

## 2017-01-25 ENCOUNTER — Other Ambulatory Visit: Payer: Self-pay | Admitting: Adult Health

## 2017-02-13 ENCOUNTER — Encounter: Payer: Self-pay | Admitting: *Deleted

## 2017-02-15 ENCOUNTER — Ambulatory Visit: Payer: BLUE CROSS/BLUE SHIELD | Admitting: Pediatrics

## 2017-03-07 ENCOUNTER — Ambulatory Visit: Payer: BLUE CROSS/BLUE SHIELD | Admitting: Pediatrics

## 2017-03-08 ENCOUNTER — Other Ambulatory Visit: Payer: Self-pay | Admitting: Adult Health

## 2017-03-08 ENCOUNTER — Ambulatory Visit: Payer: BLUE CROSS/BLUE SHIELD | Admitting: Pediatrics

## 2017-03-21 ENCOUNTER — Ambulatory Visit (INDEPENDENT_AMBULATORY_CARE_PROVIDER_SITE_OTHER): Payer: BLUE CROSS/BLUE SHIELD | Admitting: Pediatrics

## 2017-03-21 ENCOUNTER — Encounter: Payer: Self-pay | Admitting: Pediatrics

## 2017-03-21 VITALS — BP 98/67 | HR 70 | Temp 97.3°F | Ht 64.5 in | Wt 138.0 lb

## 2017-03-21 DIAGNOSIS — Z1211 Encounter for screening for malignant neoplasm of colon: Secondary | ICD-10-CM

## 2017-03-21 DIAGNOSIS — M654 Radial styloid tenosynovitis [de Quervain]: Secondary | ICD-10-CM | POA: Diagnosis not present

## 2017-03-21 NOTE — Progress Notes (Signed)
  Subjective:   Patient ID: Vanessa Beasley, female    DOB: April 05, 1966, 51 y.o.   MRN: [id number] CC: New Patient (Initial Visit) and Wrist Pain (2 months, thumb to wrist)  HPI: Vanessa Beasley is a 51 y.o. female presenting for New Patient (Initial Visit) and Wrist Pain (2 months, thumb to wrist)  Has been having wrist and thumb pain for past couple of months Does repetitive motions with R hand Since work got her a better supportive keyboard pain has improved No fall, no injury Trying to open bottle had a lot of pain at base of thumb joint a few weeks ago, had to grab/support thumb Overall thinks has been improving with wrist brace, rest, not as painful as before No swelling  Emotional lability around period: prozac helping a lot  Fam hx: no known family h/o colon cancer  Past Medical History:  Diagnosis Date  . Anemia 06/23/2014  . Fatigue 06/01/2013  . Menorrhagia 06/23/2014  . PMS (premenstrual syndrome) 06/01/2013  . Vaginal discharge 06/01/2013   Family History  Problem Relation Age of Onset  . Cancer Father        lung   . Diabetes Paternal Grandmother   . Cancer Paternal Grandmother        colon   Social History   Social History  . Marital status: Married    Spouse name: N/A  . Number of children: N/A  . Years of education: N/A   Social History Main Topics  . Smoking status: Former Smoker    Years: 5.00    Types: Cigarettes  . Smokeless tobacco: Never Used  . Alcohol use No  . Drug use: No  . Sexual activity: Yes    Birth control/ protection: Other-see comments     Comment: vasectomy   Other Topics Concern  . None   Social History Narrative  . None   ROS: All systems negative other than what is in HPI  Objective:    BP 98/67   Pulse 70   Temp 97.3 F (36.3 C) (Oral)   Ht 5' 4.5" (1.638 m)   Wt 138 lb (62.6 kg)   BMI 23.32 kg/m   Wt Readings from Last 3 Encounters:  03/21/17 138 lb (62.6 kg)  06/11/16 133 lb (60.3 kg)  06/10/15 147 lb (66.7 kg)      Gen: NAD, alert, cooperative with exam, NCAT EYES: EOMI, no conjunctival injection, or no icterus ENT:  TMs gray, scarred b/l, OP without erythema LYMPH: small < 1cm b/l ant cervical LAD CV: NRRR, normal S1/S2, no murmur Resp: CTABL, no wheezes, normal WOB Abd: +BS, soft, NTND. no guarding or organomegaly Ext: No edema, warm Neuro: Alert and oriented, strength equal b/l UE and LE, coordination grossly normal MSK:  R wrist-- normal to inspection, no redness or swelling +finkelsteins R side, ttp radial stylus, normal ROM of wrist, no tenderness in snuffbox, no other tenderness in wrist, normal hand grip b/l, no ttp along radius  Assessment & Plan:  Augustine was seen today for new patient (initial visit) and wrist pain.  Diagnoses and all orders for this visit:  Tenosynovitis, de Quervain Discussed rest, ice, NSAIDs, wrist brace Avoid activities that make pain worse If not improving let me know, will refer to sports medicine  Colon cancer screening -     Fecal occult blood, imunochemical; Future   Follow up plan: 1 yr, sooner if needed Assunta Found, MD [place]

## 2017-03-21 NOTE — Patient Instructions (Addendum)
De Quervain Tenosynovitis  Tendons attach muscles to bones. They also help with joint movements. When tendons become irritated or swollen, it is called tendinitis.  The extensor pollicis brevis (EPB) tendon connects the EPB muscle to a bone that is near the base of the thumb. The EPB muscle helps to straighten and extend the thumb. De Quervain tenosynovitis is a condition in which the EPB tendon lining (sheath) becomes irritated, thickened, and swollen. This condition is sometimes called stenosing tenosynovitis. This condition causes pain on the thumb side of the back of the wrist.  What are the causes?  Causes of this condition include:   Activities that repeatedly cause your thumb and wrist to extend.   A sudden increase in activity or change in activity that affects your wrist.    What increases the risk?  This condition is more likely to develop in:   Females.   People who have diabetes.   Women who have recently given birth.   People who are over 40 years of age.   People who do activities that involve repeated hand and wrist motions, such as tennis, racquetball, volleyball, gardening, and taking care of children.   People who do heavy labor.   People who have poor wrist strength and flexibility.   People who do not warm up properly before activities.    What are the signs or symptoms?  Symptoms of this condition include:   Pain or tenderness over the thumb side of the back of the wrist when your thumb and wrist are not moving.   Pain that gets worse when you straighten your thumb or extend your thumb or wrist.   Pain when the injured area is touched.   Locking or catching of the thumb joint while you bend and straighten your thumb.   Decreased thumb motion due to pain.   Swelling over the affected area.    How is this diagnosed?  This condition is diagnosed with a medical history and physical exam. Your health care provider will ask for details about your injury and ask about your  symptoms.  How is this treated?  Treatment may include the use of icing and medicines to reduce pain and swelling. You may also be advised to wear a splint or brace to limit your thumb and wrist motion. In less severe cases, treatment may also include working with a physical therapist to strengthen your wrist and calm the irritation around your EPB tendon sheath. In severe cases, surgery may be needed.  Follow these instructions at home:  If you have a splint or brace:   Wear it as told by your health care provider. Remove it only as told by your health care provider.   Loosen the splint or brace if your fingers become numb and tingle, or if they turn cold and blue.   Keep the splint or brace clean and dry.  Managing pain, stiffness, and swelling   If directed, apply ice to the injured area.  ? Put ice in a plastic bag.  ? Place a towel between your skin and the bag.  ? Leave the ice on for 20 minutes, 2-3 times per day.   Move your fingers often to avoid stiffness and to lessen swelling.   Raise (elevate) the injured area above the level of your heart while you are sitting or lying down.  General instructions   Return to your normal activities as told by your health care provider. Ask your health care provider   what activities are safe for you.   Take over-the-counter and prescription medicines only as told by your health care provider.   Keep all follow-up visits as told by your health care provider. This is important.   Do not drive or operate heavy machinery while taking prescription pain medicine.  Contact a health care provider if:   Your pain, tenderness, or swelling gets worse, even if you have had treatment.   You have numbness or tingling in your wrist, hand, or fingers on the injured side.  This information is not intended to replace advice given to you by your health care provider. Make sure you discuss any questions you have with your health care provider.  Document Released: 10/08/2005  Document Revised: 03/15/2016 Document Reviewed: 12/14/2014  Elsevier Interactive Patient Education  2018 [employer]

## 2017-06-12 ENCOUNTER — Other Ambulatory Visit: Payer: Self-pay | Admitting: Adult Health

## 2017-06-12 DIAGNOSIS — Z1231 Encounter for screening mammogram for malignant neoplasm of breast: Secondary | ICD-10-CM

## 2017-06-26 ENCOUNTER — Ambulatory Visit (INDEPENDENT_AMBULATORY_CARE_PROVIDER_SITE_OTHER): Payer: BLUE CROSS/BLUE SHIELD | Admitting: Adult Health

## 2017-06-26 ENCOUNTER — Encounter: Payer: Self-pay | Admitting: Adult Health

## 2017-06-26 VITALS — BP 104/66 | HR 80 | Ht 65.0 in | Wt 141.0 lb

## 2017-06-26 DIAGNOSIS — Z01419 Encounter for gynecological examination (general) (routine) without abnormal findings: Secondary | ICD-10-CM | POA: Insufficient documentation

## 2017-06-26 DIAGNOSIS — E559 Vitamin D deficiency, unspecified: Secondary | ICD-10-CM

## 2017-06-26 DIAGNOSIS — Z1212 Encounter for screening for malignant neoplasm of rectum: Secondary | ICD-10-CM

## 2017-06-26 DIAGNOSIS — Z1211 Encounter for screening for malignant neoplasm of colon: Secondary | ICD-10-CM

## 2017-06-26 DIAGNOSIS — N943 Premenstrual tension syndrome: Secondary | ICD-10-CM

## 2017-06-26 LAB — HEMOCCULT GUIAC POC 1CARD (OFFICE): Fecal Occult Blood, POC: NEGATIVE

## 2017-06-26 MED ORDER — FLUOXETINE HCL 20 MG PO CAPS: 20.0000 mg | ORAL_CAPSULE | Freq: Every day | ORAL | 4 refills | Status: DC

## 2017-06-26 MED ORDER — NITROFURANTOIN MONOHYD MACRO 100 MG PO CAPS: ORAL_CAPSULE | ORAL | 3 refills | Status: DC

## 2017-06-26 NOTE — Patient Instructions (Addendum)
Physical and pap in  1 year Mammogram yearly Colonoscopy advised

## 2017-06-26 NOTE — Progress Notes (Signed)
Patient ID: Vanessa Beasley, female   DOB: [birth year], 51 y.o.   MRN: [id number] History of Present Illness: Vanessa Beasley is a 51 year old white female, married in for a well woman gyn exam,she had a normal pap with negative HPV 06/10/15.    Current Medications, Allergies, Past Medical History, Past Surgical History, Family History and Social History were reviewed in [employer] record.     Review of Systems: Patient denies any headaches, hearing loss, fatigue, blurred vision, shortness of breath, chest pain, abdominal pain, problems with bowel movements, urination, or intercourse. No joint pain or mood swings. Still having periods.   Physical Exam:BP 104/66 (BP Location: Right Arm, Patient Position: Sitting, Cuff Size: Normal)   Pulse 80   Ht 5\' 5"  (1.651 m)   Wt 141 lb (64 kg)   LMP 06/02/2017   BMI 23.46 kg/m  General:  Well developed, well nourished, no acute distress Skin:  Warm and dry Neck:  Midline trachea, normal thyroid, good ROM, no lymphadenopathy Lungs; Clear to auscultation bilaterally Breast:  No dominant palpable mass, retraction, or nipple discharge Cardiovascular: Regular rate and rhythm Abdomen:  Soft, non tender, no hepatosplenomegaly Pelvic:  External genitalia is normal in appearance, but has some mild redness from shaving.  The vagina is normal in appearance. Urethra has no lesions or masses. The cervix is bulbous.  Uterus is felt to be normal size, shape, and contour.  No adnexal masses or tenderness noted.Bladder is non tender, no masses felt. Rectal: Good sphincter tone, no polyps, or hemorrhoids felt.  Hemoccult negative. Extremities/musculoskeletal:  No swelling or varicosities noted, no clubbing or cyanosis Psych:  No mood changes, alert and cooperative,seems happy PHQ 2 score 0  Impression:  1. Well woman exam with routine gynecological exam   2. Vitamin D insufficiency   3. PMS (premenstrual syndrome)   4. Screening for colorectal cancer       Plan: Meds ordered this encounter  Medications  . nitrofurantoin, macrocrystal-monohydrate, (MACROBID) 100 MG capsule    Sig: TAKE 1 CAPSULE BY MOUTH AS DIRECTED AFTER SEX    Dispense:  30 capsule    Refill:  3    Order Specific Question:   Supervising Provider    Answer:   Elonda Husky, LUTHER H [[id number]]  . FLUoxetine (PROZAC) 20 MG capsule    Sig: Take 1 capsule (20 mg total) by mouth daily.    Dispense:  90 capsule    Refill:  4    Order Specific Question:   Supervising Provider    Answer:   Tania Ade H [[id number]]   Check CBC,CMP,TSH and lipids,vitamin D Physical and pap in  1 year Mammogram yearly Colonoscopy advised

## 2017-06-27 LAB — COMPREHENSIVE METABOLIC PANEL
ALT: 14 IU/L (ref 0–32)
AST: 20 IU/L (ref 0–40)
Albumin/Globulin Ratio: 1.4 (ref 1.2–2.2)
Albumin: 4.6 g/dL (ref 3.5–5.5)
Alkaline Phosphatase: 88 IU/L (ref 39–117)
BUN/Creatinine Ratio: 14 (ref 9–23)
BUN: 11 mg/dL (ref 6–24)
Bilirubin Total: 0.4 mg/dL (ref 0.0–1.2)
CO2: 24 mmol/L (ref 20–29)
Calcium: 9.5 mg/dL (ref 8.7–10.2)
Chloride: 98 mmol/L (ref 96–106)
Creatinine, Ser: 0.8 mg/dL (ref 0.57–1.00)
GFR calc Af Amer: 99 mL/min/{1.73_m2} (ref >59)
GFR calc non Af Amer: 86 mL/min/{1.73_m2} (ref >59)
Globulin, Total: 3.2 g/dL (ref 1.5–4.5)
Glucose: 85 mg/dL (ref 65–99)
Potassium: 4.3 mmol/L (ref 3.5–5.2)
Sodium: 137 mmol/L (ref 134–144)
Total Protein: 7.8 g/dL (ref 6.0–8.5)

## 2017-06-27 LAB — LIPID PANEL
Chol/HDL Ratio: 4.3 ratio (ref 0.0–4.4)
Cholesterol, Total: 217 mg/dL — ABNORMAL HIGH (ref 100–199)
HDL: 51 mg/dL (ref >39)
LDL Calculated: 130 mg/dL — ABNORMAL HIGH (ref 0–99)
Triglycerides: 182 mg/dL — ABNORMAL HIGH (ref 0–149)
VLDL Cholesterol Cal: 36 mg/dL (ref 5–40)

## 2017-06-27 LAB — CBC
Hematocrit: 40.2 % (ref 34.0–46.6)
Hemoglobin: 13.2 g/dL (ref 11.1–15.9)
MCH: 30.3 pg (ref 26.6–33.0)
MCHC: 32.8 g/dL (ref 31.5–35.7)
MCV: 92 fL (ref 79–97)
Platelets: 229 10*3/uL (ref 150–379)
RBC: 4.35 x10E6/uL (ref 3.77–5.28)
RDW: 13.7 % (ref 12.3–15.4)
WBC: 7.1 10*3/uL (ref 3.4–10.8)

## 2017-06-27 LAB — TSH: TSH: 2.61 u[IU]/mL (ref 0.450–4.500)

## 2017-06-27 LAB — VITAMIN D 25 HYDROXY (VIT D DEFICIENCY, FRACTURES): Vit D, 25-Hydroxy: 41.5 ng/mL (ref 30.0–100.0)

## 2017-07-04 ENCOUNTER — Ambulatory Visit (HOSPITAL_COMMUNITY)
Admission: RE | Admit: 2017-07-04 | Discharge: 2017-07-04 | Disposition: A | Discharge status: Home / Self Care | Payer: BLUE CROSS/BLUE SHIELD | Source: Ambulatory Visit | Attending: Adult Health | Admitting: Adult Health

## 2017-07-04 DIAGNOSIS — Z1231 Encounter for screening mammogram for malignant neoplasm of breast: Secondary | ICD-10-CM | POA: Insufficient documentation

## 2018-01-13 ENCOUNTER — Ambulatory Visit: Payer: BLUE CROSS/BLUE SHIELD | Admitting: Family Medicine

## 2018-01-13 ENCOUNTER — Encounter: Payer: Self-pay | Admitting: Family Medicine

## 2018-01-13 VITALS — BP 104/70 | HR 70 | Temp 98.1°F | Ht 65.0 in | Wt 143.4 lb

## 2018-01-13 DIAGNOSIS — J0100 Acute maxillary sinusitis, unspecified: Secondary | ICD-10-CM

## 2018-01-13 MED ORDER — HYDROCODONE-HOMATROPINE 5-1.5 MG/5ML PO SYRP: 5.0000 mL | ORAL_SOLUTION | Freq: Four times a day (QID) | ORAL | 0 refills | Status: DC | PRN

## 2018-01-13 MED ORDER — AMOXICILLIN-POT CLAVULANATE 875-125 MG PO TABS: 1.0000 | ORAL_TABLET | Freq: Two times a day (BID) | ORAL | 0 refills | Status: DC

## 2018-01-13 NOTE — Progress Notes (Signed)
   HPI  Patient presents today here with acute illness.  Patient's had 4 days of cough, congestion, sore throat, headache, and right ear pain.  Patient states she is also had left-sided maxillary sinus pain and muffled hearing in the right ear.  This morning she developed a rash over the chest and abdomen.  It is slightly itchy.   PMH: Smoking status noted ROS: Per HPI  Objective: BP 104/70   Pulse 70   Temp 98.1 F (36.7 C) (Oral)   Ht 5\' 5"  (1.651 m)   Wt 143 lb 6.4 oz (65 kg)   SpO2 96%   BMI 23.86 kg/m  Gen: NAD, alert, cooperative with exam HEENT: NCAT, positive tenderness to palpation of left maxillary sinus, oropharynx moist and clear, TMs with erythema but preserved landmarks on the right CV: RRR, good S1/S2, no murmur Resp: CTABL, no wheezes, non-labored Ext: No edema, warm Neuro: Alert and oriented, No gross deficits Skin: Scattered erythematous papular macular rash over the abdomen and chest  Assessment and plan:  #Maxillary sinusitis Most likely viral illness that has ended and viral exanthem, patient does have what appears to be an evolving right ear infection likely as well as left-sided maxillary sinusitis. Treat with Augmentin, discussed viral leading to problems   Meds ordered this encounter  Medications  . amoxicillin-clavulanate (AUGMENTIN) 875-125 MG tablet    Sig: Take 1 tablet by mouth 2 (two) times daily.    Dispense:  20 tablet    Refill:  0  . DISCONTD: HYDROcodone-homatropine (HYCODAN) 5-1.5 MG/5ML syrup    Sig: Take 5 mLs by mouth every 6 (six) hours as needed for cough.    Dispense:  120 mL    Refill:  0  . HYDROcodone-homatropine (HYCODAN) 5-1.5 MG/5ML syrup    Sig: Take 5 mLs by mouth every 6 (six) hours as needed for cough.    Dispense:  120 mL    Refill:  0    Laroy Apple, MD [facility] 01/13/2018, 3:16 PM

## 2018-01-13 NOTE — Patient Instructions (Signed)
Great to see you!   Sinusitis, Adult Sinusitis is soreness and inflammation of your sinuses. Sinuses are hollow spaces in the bones around your face. They are located:  Around your eyes.  In the middle of your forehead.  Behind your nose.  In your cheekbones.  Your sinuses and nasal passages are lined with a stringy fluid (mucus). Mucus normally drains out of your sinuses. When your nasal tissues get inflamed or swollen, the mucus can get trapped or blocked so air cannot flow through your sinuses. This lets bacteria, viruses, and funguses grow, and that leads to infection. Follow these instructions at home: Medicines  Take, use, or apply over-the-counter and prescription medicines only as told by your doctor. These may include nasal sprays.  If you were prescribed an antibiotic medicine, take it as told by your doctor. Do not stop taking the antibiotic even if you start to feel better. Hydrate and Humidify  Drink enough water to keep your pee (urine) clear or pale yellow.  Use a cool mist humidifier to keep the humidity level in your home above 50%.  Breathe in steam for 10-15 minutes, 3-4 times a day or as told by your doctor. You can do this in the bathroom while a hot shower is running.  Try not to spend time in cool or dry air. Rest  Rest as much as possible.  Sleep with your head raised (elevated).  Make sure to get enough sleep each night. General instructions  Put a warm, moist washcloth on your face 3-4 times a day or as told by your doctor. This will help with discomfort.  Wash your hands often with soap and water. If there is no soap and water, use hand sanitizer.  Do not smoke. Avoid being around people who are smoking (secondhand smoke).  Keep all follow-up visits as told by your doctor. This is important. Contact a doctor if:  You have a fever.  Your symptoms get worse.  Your symptoms do not get better within 10 days. Get help right away if:  You  have a very bad headache.  You cannot stop throwing up (vomiting).  You have pain or swelling around your face or eyes.  You have trouble seeing.  You feel confused.  Your neck is stiff.  You have trouble breathing. This information is not intended to replace advice given to you by your health care provider. Make sure you discuss any questions you have with your health care provider. Document Released: 03/26/2008 Document Revised: 06/03/2016 Document Reviewed: 08/03/2015 Elsevier Interactive Patient Education  2018 Elsevier Inc.  

## 2018-01-24 ENCOUNTER — Other Ambulatory Visit: Payer: Self-pay | Admitting: Adult Health

## 2018-01-24 ENCOUNTER — Encounter: Payer: Self-pay | Admitting: Adult Health

## 2018-01-24 MED ORDER — NYSTATIN-TRIAMCINOLONE 100000-0.1 UNIT/GM-% EX CREA: 1.0000 "application " | TOPICAL_CREAM | Freq: Two times a day (BID) | CUTANEOUS | 1 refills | Status: DC

## 2018-01-24 NOTE — Progress Notes (Signed)
rx mytrex

## 2018-06-04 ENCOUNTER — Other Ambulatory Visit: Payer: Self-pay | Admitting: Adult Health

## 2018-06-04 DIAGNOSIS — Z1231 Encounter for screening mammogram for malignant neoplasm of breast: Secondary | ICD-10-CM

## 2018-07-10 ENCOUNTER — Other Ambulatory Visit: Payer: Self-pay

## 2018-07-10 ENCOUNTER — Other Ambulatory Visit (HOSPITAL_COMMUNITY)
Admission: RE | Admit: 2018-07-10 | Discharge: 2018-07-10 | Disposition: A | Discharge status: Home / Self Care | Payer: BLUE CROSS/BLUE SHIELD | Source: Ambulatory Visit | Attending: Adult Health | Admitting: Adult Health

## 2018-07-10 ENCOUNTER — Ambulatory Visit: Payer: BLUE CROSS/BLUE SHIELD | Admitting: Adult Health

## 2018-07-10 ENCOUNTER — Encounter: Payer: Self-pay | Admitting: Adult Health

## 2018-07-10 VITALS — BP 120/76 | HR 68 | Resp 16 | Ht 65.0 in | Wt 144.0 lb

## 2018-07-10 DIAGNOSIS — Z1212 Encounter for screening for malignant neoplasm of rectum: Secondary | ICD-10-CM | POA: Diagnosis not present

## 2018-07-10 DIAGNOSIS — Z01419 Encounter for gynecological examination (general) (routine) without abnormal findings: Secondary | ICD-10-CM | POA: Insufficient documentation

## 2018-07-10 DIAGNOSIS — Z1211 Encounter for screening for malignant neoplasm of colon: Secondary | ICD-10-CM | POA: Diagnosis not present

## 2018-07-10 DIAGNOSIS — N920 Excessive and frequent menstruation with regular cycle: Secondary | ICD-10-CM | POA: Diagnosis not present

## 2018-07-10 DIAGNOSIS — R238 Other skin changes: Secondary | ICD-10-CM

## 2018-07-10 DIAGNOSIS — E782 Mixed hyperlipidemia: Secondary | ICD-10-CM

## 2018-07-10 LAB — HEMOCCULT GUIAC POC 1CARD (OFFICE): Fecal Occult Blood, POC: NEGATIVE

## 2018-07-10 MED ORDER — NYSTATIN-TRIAMCINOLONE 100000-0.1 UNIT/GM-% EX CREA: 1.0000 "application " | TOPICAL_CREAM | Freq: Two times a day (BID) | CUTANEOUS | 3 refills | Status: DC

## 2018-07-10 MED ORDER — NITROFURANTOIN MONOHYD MACRO 100 MG PO CAPS: ORAL_CAPSULE | ORAL | 3 refills | Status: DC

## 2018-07-10 MED ORDER — FLUOXETINE HCL 20 MG PO CAPS: 20.0000 mg | ORAL_CAPSULE | Freq: Every day | ORAL | 4 refills | Status: DC

## 2018-07-10 NOTE — Progress Notes (Signed)
Patient ID: Vanessa Beasley, female   DOB: [birth year], 52 y.o.   MRN: [id number] History of Present Illness:  Vanessa Beasley is a 52 year old white female,married, G1P1, in for well woman gyn exam and pap.She is still having periods and they are heavy.She requests labs this morning.  PCP is Assunta Found at [facility].   Current Medications, Allergies, Past Medical History, Past Surgical History, Family History and Social History were reviewed in [employer] record.     Review of Systems: Patient denies any headaches, hearing loss, fatigue, blurred vision, shortness of breath, chest pain, abdominal pain, problems with bowel movements, urination, or intercourse. No joint pain or mood swings. Periods are heavy, and will lighten up then be heavy again before stopping, denies any hot flashes.     Physical Exam:BP 120/76 (BP Location: Right Arm, Patient Position: Sitting, Cuff Size: Normal)   Pulse 68   Resp 16   Ht 5\' 5"  (1.651 m)   Wt 144 lb (65.3 kg)   LMP 06/18/2018   BMI 23.96 kg/m  General:  Well developed, well nourished, no acute distress Skin:  Warm and dry Neck:  Midline trachea, normal thyroid, good ROM, no lymphadenopathy Lungs; Clear to auscultation bilaterally Breast:  No dominant palpable mass, retraction, or nipple discharge Cardiovascular: Regular rate and rhythm Abdomen:  Soft, non tender, no hepatosplenomegaly Pelvic:  External genitalia is normal in appearance, no lesions.  The vagina is normal in appearance. Urethra has no lesions or masses. The cervix is bulbous. Pap with HPV performed. Uterus is felt to be normal size, shape, and contour.  No adnexal masses or tenderness noted.Bladder is non tender, no masses felt.Skin has skin chaffing from pads along inner thigh area Rectal: Good sphincter tone, no polyps, or hemorrhoids felt.  Hemoccult negative. Extremities/musculoskeletal:  No swelling or varicosities noted, no clubbing or cyanosis Psych:  No  mood changes, alert and cooperative,seems happy PHQ 2 score 0. Examination chaperoned by Shela Nevin RN. Will get GYN [place] to assess uterus.   Impression:   ICD-10-CM   1. Encounter for gynecological examination with Papanicolaou smear of cervix Z01.419 Cytology - PAP    CBC    Comprehensive metabolic panel    TSH    Lipid panel  2. Screening for colorectal cancer Z12.11 POCT occult blood stool   Z12.12   3. Menorrhagia with regular cycle N92.0 CBC    TSH    US PELVIS (TRANSABDOMINAL ONLY)    US PELVIS TRANSVANGINAL NON-OB (TV ONLY)  4. Skin irritation R23.8   5. Elevated cholesterol with elevated triglycerides E78.2 Lipid panel      Plan: Check CBC,CMP,TSH and lipids Meds ordered this encounter  Medications  . FLUoxetine (PROZAC) 20 MG capsule    Sig: Take 1 capsule (20 mg total) by mouth daily.    Dispense:  90 capsule    Refill:  4    Order Specific Question:   Supervising Provider    Answer:   Elonda Husky, LUTHER H [[id number]]  . nitrofurantoin, macrocrystal-monohydrate, (MACROBID) 100 MG capsule    Sig: TAKE 1 CAPSULE BY MOUTH AS DIRECTED AFTER SEX    Dispense:  30 capsule    Refill:  3    Order Specific Question:   Supervising Provider    Answer:   Elonda Husky, LUTHER H [[id number]]  . nystatin-triamcinolone (MYCOLOG II) cream    Sig: Apply 1 application topically 2 (two) times daily.    Dispense:  30 g    Refill:  3    Order Specific Question:   Supervising Provider    Answer:   Florian Buff [[id number]]  Return in 1 week for GYN [place]  Physical in 1 year Pap in 3 if normal Discussed cologuard,she is to check her insurance and let me know

## 2018-07-11 LAB — COMPREHENSIVE METABOLIC PANEL
ALT: 24 IU/L (ref 0–32)
AST: 19 IU/L (ref 0–40)
Albumin/Globulin Ratio: 1.5 (ref 1.2–2.2)
Albumin: 4.4 g/dL (ref 3.5–5.5)
Alkaline Phosphatase: 98 IU/L (ref 39–117)
BUN/Creatinine Ratio: 13 (ref 9–23)
BUN: 10 mg/dL (ref 6–24)
Bilirubin Total: 0.4 mg/dL (ref 0.0–1.2)
CO2: 24 mmol/L (ref 20–29)
Calcium: 9.3 mg/dL (ref 8.7–10.2)
Chloride: 102 mmol/L (ref 96–106)
Creatinine, Ser: 0.77 mg/dL (ref 0.57–1.00)
GFR calc Af Amer: 103 mL/min/{1.73_m2} (ref >59)
GFR calc non Af Amer: 90 mL/min/{1.73_m2} (ref >59)
Globulin, Total: 3 g/dL (ref 1.5–4.5)
Glucose: 91 mg/dL (ref 65–99)
Potassium: 3.8 mmol/L (ref 3.5–5.2)
Sodium: 140 mmol/L (ref 134–144)
Total Protein: 7.4 g/dL (ref 6.0–8.5)

## 2018-07-11 LAB — CBC
Hematocrit: 39.4 % (ref 34.0–46.6)
Hemoglobin: 13.2 g/dL (ref 11.1–15.9)
MCH: 30.6 pg (ref 26.6–33.0)
MCHC: 33.5 g/dL (ref 31.5–35.7)
MCV: 91 fL (ref 79–97)
Platelets: 226 10*3/uL (ref 150–450)
RBC: 4.32 x10E6/uL (ref 3.77–5.28)
RDW: 12.8 % (ref 12.3–15.4)
WBC: 7.6 10*3/uL (ref 3.4–10.8)

## 2018-07-11 LAB — CYTOLOGY - PAP
Diagnosis: NEGATIVE
HPV: NOT DETECTED

## 2018-07-11 LAB — LIPID PANEL
Chol/HDL Ratio: 4.9 ratio — ABNORMAL HIGH (ref 0.0–4.4)
Cholesterol, Total: 205 mg/dL — ABNORMAL HIGH (ref 100–199)
HDL: 42 mg/dL (ref >39)
LDL Calculated: 114 mg/dL — ABNORMAL HIGH (ref 0–99)
Triglycerides: 243 mg/dL — ABNORMAL HIGH (ref 0–149)
VLDL Cholesterol Cal: 49 mg/dL — ABNORMAL HIGH (ref 5–40)

## 2018-07-11 LAB — TSH: TSH: 2.27 u[IU]/mL (ref 0.450–4.500)

## 2018-07-17 ENCOUNTER — Ambulatory Visit (INDEPENDENT_AMBULATORY_CARE_PROVIDER_SITE_OTHER): Payer: BLUE CROSS/BLUE SHIELD

## 2018-07-17 DIAGNOSIS — N920 Excessive and frequent menstruation with regular cycle: Secondary | ICD-10-CM | POA: Diagnosis not present

## 2018-07-17 NOTE — Progress Notes (Signed)
PELVIC US TA/TV:heterogeneous anteverted uterus w/a posterior submucosal fibroid 2.2 x 2.2 x 2.8 cm,EEC 8.1 mm,endometrium is distorted by submucosal fibroids,normal ovaries bialt,unable to slide ovaries,no pain during ultrasound,no free fluid

## 2018-07-18 ENCOUNTER — Telehealth: Payer: Self-pay | Admitting: Adult Health

## 2018-07-18 NOTE — Telephone Encounter (Signed)
Pt aware that US shows small fibroid and normal ovaries, but has not ben read by MD yet

## 2018-07-21 ENCOUNTER — Encounter (HOSPITAL_COMMUNITY): Payer: Self-pay

## 2018-07-21 ENCOUNTER — Ambulatory Visit (HOSPITAL_COMMUNITY)
Admission: RE | Admit: 2018-07-21 | Discharge: 2018-07-21 | Disposition: A | Discharge status: Home / Self Care | Payer: BLUE CROSS/BLUE SHIELD | Source: Ambulatory Visit | Attending: Adult Health | Admitting: Adult Health

## 2018-07-21 DIAGNOSIS — Z1231 Encounter for screening mammogram for malignant neoplasm of breast: Secondary | ICD-10-CM | POA: Diagnosis not present

## 2019-06-09 ENCOUNTER — Other Ambulatory Visit: Payer: Self-pay | Admitting: Women's Health

## 2019-06-09 DIAGNOSIS — N921 Excessive and frequent menstruation with irregular cycle: Secondary | ICD-10-CM

## 2019-06-09 MED ORDER — MEGESTROL ACETATE 40 MG PO TABS: ORAL_TABLET | ORAL | 1 refills | Status: DC

## 2019-07-13 DIAGNOSIS — Z23 Encounter for immunization: Secondary | ICD-10-CM | POA: Diagnosis not present

## 2019-07-30 ENCOUNTER — Other Ambulatory Visit: Payer: Self-pay | Admitting: Adult Health

## 2019-07-30 DIAGNOSIS — Z01419 Encounter for gynecological examination (general) (routine) without abnormal findings: Secondary | ICD-10-CM

## 2019-07-30 DIAGNOSIS — E782 Mixed hyperlipidemia: Secondary | ICD-10-CM

## 2019-07-30 NOTE — Progress Notes (Signed)
Check CBC,CMP,TSH and lipids  

## 2019-07-31 DIAGNOSIS — Z01419 Encounter for gynecological examination (general) (routine) without abnormal findings: Secondary | ICD-10-CM | POA: Diagnosis not present

## 2019-07-31 DIAGNOSIS — E782 Mixed hyperlipidemia: Secondary | ICD-10-CM | POA: Diagnosis not present

## 2019-08-01 LAB — LIPID PANEL
Chol/HDL Ratio: 4.6 ratio — ABNORMAL HIGH (ref 0.0–4.4)
Cholesterol, Total: 179 mg/dL (ref 100–199)
HDL: 39 mg/dL — ABNORMAL LOW (ref >39)
LDL Chol Calc (NIH): 111 mg/dL — ABNORMAL HIGH (ref 0–99)
Triglycerides: 163 mg/dL — ABNORMAL HIGH (ref 0–149)
VLDL Cholesterol Cal: 29 mg/dL (ref 5–40)

## 2019-08-01 LAB — COMPREHENSIVE METABOLIC PANEL
ALT: 15 IU/L (ref 0–32)
AST: 16 IU/L (ref 0–40)
Albumin/Globulin Ratio: 1.5 (ref 1.2–2.2)
Albumin: 4.4 g/dL (ref 3.8–4.9)
Alkaline Phosphatase: 80 IU/L (ref 39–117)
BUN/Creatinine Ratio: 11 (ref 9–23)
BUN: 9 mg/dL (ref 6–24)
Bilirubin Total: 0.3 mg/dL (ref 0.0–1.2)
CO2: 22 mmol/L (ref 20–29)
Calcium: 9.3 mg/dL (ref 8.7–10.2)
Chloride: 103 mmol/L (ref 96–106)
Creatinine, Ser: 0.83 mg/dL (ref 0.57–1.00)
GFR calc Af Amer: 94 mL/min/{1.73_m2} (ref >59)
GFR calc non Af Amer: 81 mL/min/{1.73_m2} (ref >59)
Globulin, Total: 2.9 g/dL (ref 1.5–4.5)
Glucose: 96 mg/dL (ref 65–99)
Potassium: 4.7 mmol/L (ref 3.5–5.2)
Sodium: 138 mmol/L (ref 134–144)
Total Protein: 7.3 g/dL (ref 6.0–8.5)

## 2019-08-01 LAB — CBC
Hematocrit: 36.6 % (ref 34.0–46.6)
Hemoglobin: 12.1 g/dL (ref 11.1–15.9)
MCH: 30.3 pg (ref 26.6–33.0)
MCHC: 33.1 g/dL (ref 31.5–35.7)
MCV: 92 fL (ref 79–97)
Platelets: 257 10*3/uL (ref 150–450)
RBC: 3.99 x10E6/uL (ref 3.77–5.28)
RDW: 11.7 % (ref 11.7–15.4)
WBC: 5.9 10*3/uL (ref 3.4–10.8)

## 2019-08-01 LAB — TSH: TSH: 2.01 u[IU]/mL (ref 0.450–4.500)

## 2019-08-04 ENCOUNTER — Other Ambulatory Visit: Payer: Self-pay | Admitting: Adult Health

## 2019-08-29 DIAGNOSIS — Z20828 Contact with and (suspected) exposure to other viral communicable diseases: Secondary | ICD-10-CM | POA: Diagnosis not present

## 2019-09-02 ENCOUNTER — Telehealth: Payer: Self-pay | Admitting: Family Medicine

## 2019-09-02 NOTE — Telephone Encounter (Signed)
Patient tested positive for Covid today.  Husband called to find out what he should watch out for.  He reports patient has mild cough and sinus pressure.  No fever, shortness of breath, body aches.  Advised him to watch for fever, shortness of breath, excessive coughing.  If he notices anything he is to take her to the emergency room.

## 2019-09-24 ENCOUNTER — Ambulatory Visit (INDEPENDENT_AMBULATORY_CARE_PROVIDER_SITE_OTHER): Payer: BLUE CROSS/BLUE SHIELD | Admitting: Adult Health

## 2019-09-24 ENCOUNTER — Other Ambulatory Visit: Payer: Self-pay

## 2019-09-24 ENCOUNTER — Encounter: Payer: Self-pay | Admitting: Adult Health

## 2019-09-24 VITALS — BP 115/80 | HR 77 | Ht 64.5 in | Wt 148.5 lb

## 2019-09-24 DIAGNOSIS — N921 Excessive and frequent menstruation with irregular cycle: Secondary | ICD-10-CM

## 2019-09-24 DIAGNOSIS — Z01419 Encounter for gynecological examination (general) (routine) without abnormal findings: Secondary | ICD-10-CM | POA: Diagnosis not present

## 2019-09-24 MED ORDER — FLUOXETINE HCL 20 MG PO CAPS: 20.0000 mg | ORAL_CAPSULE | Freq: Every day | ORAL | 4 refills | Status: DC

## 2019-09-24 NOTE — Progress Notes (Signed)
Patient ID: Vanessa Beasley, female   DOB: 29-Dec-1965, 53 y.o.   MRN: [id number] History of Present Illness: Vanessa Beasley is a 53 year old white female, married, G1P1 in for a well woman gyn exam, she had a normal pap with negative HPV 07/10/18. She is having heavy periods at times, and she actually skipped her first one last month.   Current Medications, Allergies, Past Medical History, Past Surgical History, Family History and Social History were reviewed in [employer] record.     Review of Systems: Patient denies any headaches, hearing loss, fatigue, blurred vision, shortness of breath, chest pain, abdominal pain, problems with bowel movements, urination, or intercourse. No joint pain or mood swings.(she is on Prozac) See HPI for positives.    Physical Exam:BP 115/80 (BP Location: Left Arm, Patient Position: Sitting, Cuff Size: Normal)   Pulse 77   Ht 5' 4.5" (1.638 m)   Wt 148 lb 8 oz (67.4 kg)   LMP 09/22/2019   BMI 25.10 kg/m  General:  Well developed, well nourished, no acute distress Skin:  Warm and dry Neck:  Midline trachea, normal thyroid, good ROM, no lymphadenopathy Lungs; Clear to auscultation bilaterally Breast:  No dominant palpable mass, retraction, or nipple discharge Cardiovascular: Regular rate and rhythm Abdomen:  Soft, non tender, no hepatosplenomegaly Pelvic:  External genitalia is normal in appearance, no lesions.  The vagina is normal in appearance,+period blood. Urethra has no lesions or masses. The cervix is bulbous.  Uterus is felt to be normal size, shape, and contour.  No adnexal masses or tenderness noted.Bladder is non tender, no masses felt. Rectal: Deferred due to bleeding, will send hemoccult cards home with her Extremities/musculoskeletal:  No swelling or varicosities noted, no clubbing or cyanosis Psych:  No mood changes, alert and cooperative,seems happy Fall risk is low PHQ 2 score 0 Co exam with Weyman Croon FNP student.    Impression and Plan: 1. Encounter for well woman exam with routine gynecological exam Physical in 1 year Pap in 2022 Get mammogram  3 hemoccult cards sent home with her, discussed colonoscopy in future   Will refill Prozac Meds ordered this encounter  Medications  . FLUoxetine (PROZAC) 20 MG capsule    Sig: Take 1 capsule (20 mg total) by mouth daily.    Dispense:  90 capsule    Refill:  4    Order Specific Question:   Supervising Provider    Answer:   Elonda Husky, LUTHER H [[id number]]    2. Menorrhagia with irregular cycle Has megace if needed Discussed being menopausal Review handout on menopause Could try HRT later if desired

## 2019-09-24 NOTE — Patient Instructions (Signed)
Menopause Menopause is the normal time of life when menstrual periods stop completely. It is usually confirmed by 12 months without a menstrual period. The transition to menopause (perimenopause) most often happens between the ages of 45 and 55. During perimenopause, hormone levels change in your body, which can cause symptoms and affect your health. Menopause may increase your risk for:  Loss of bone (osteoporosis), which causes bone breaks (fractures).  Depression.  Hardening and narrowing of the arteries (atherosclerosis), which can cause heart attacks and strokes. What are the causes? This condition is usually caused by a natural change in hormone levels that happens as you get older. The condition may also be caused by surgery to remove both ovaries (bilateral oophorectomy). What increases the risk? This condition is more likely to start at an earlier age if you have certain medical conditions or treatments, including:  A tumor of the pituitary gland in the brain.  A disease that affects the ovaries and hormone production.  Radiation treatment for cancer.  Certain cancer treatments, such as chemotherapy or hormone (anti-estrogen) therapy.  Heavy smoking and excessive alcohol use.  Family history of early menopause. This condition is also more likely to develop earlier in women who are very thin. What are the signs or symptoms? Symptoms of this condition include:  Hot flashes.  Irregular menstrual periods.  Night sweats.  Changes in feelings about sex. This could be a decrease in sex drive or an increased comfort around your sexuality.  Vaginal dryness and thinning of the vaginal walls. This may cause painful intercourse.  Dryness of the skin and development of wrinkles.  Headaches.  Problems sleeping (insomnia).  Mood swings or irritability.  Memory problems.  Weight gain.  Hair growth on the face and chest.  Bladder infections or problems with urinating. How  is this diagnosed? This condition is diagnosed based on your medical history, a physical exam, your age, your menstrual history, and your symptoms. Hormone tests may also be done. How is this treated? In some cases, no treatment is needed. You and your health care provider should make a decision together about whether treatment is necessary. Treatment will be based on your individual condition and preferences. Treatment for this condition focuses on managing symptoms. Treatment may include:  Menopausal hormone therapy (MHT).  Medicines to treat specific symptoms or complications.  Acupuncture.  Vitamin or herbal supplements. Before starting treatment, make sure to let your health care provider know if you have a personal or family history of:  Heart disease.  Breast cancer.  Blood clots.  Diabetes.  Osteoporosis. Follow these instructions at home: Lifestyle  Do not use any products that contain nicotine or tobacco, such as cigarettes and e-cigarettes. If you need help quitting, ask your health care provider.  Get at least 30 minutes of physical activity on 5 or more days each week.  Avoid alcoholic and caffeinated beverages, as well as spicy foods. This may help prevent hot flashes.  Get 7-8 hours of sleep each night.  If you have hot flashes, try: ? Dressing in layers. ? Avoiding things that may trigger hot flashes, such as spicy food, warm places, or stress. ? Taking slow, deep breaths when a hot flash starts. ? Keeping a fan in your home and office.  Find ways to manage stress, such as deep breathing, meditation, or journaling.  Consider going to group therapy with other women who are having menopause symptoms. Ask your health care provider about recommended group therapy meetings. Eating and   drinking  Eat a healthy, balanced diet that contains whole grains, lean protein, low-fat dairy, and plenty of fruits and vegetables.  Your health care provider may recommend  adding more soy to your diet. Foods that contain soy include tofu, tempeh, and soy milk.  Eat plenty of foods that contain calcium and vitamin D for bone health. Items that are rich in calcium include low-fat milk, yogurt, beans, almonds, sardines, broccoli, and kale. Medicines  Take over-the-counter and prescription medicines only as told by your health care provider.  Talk with your health care provider before starting any herbal supplements. If prescribed, take vitamins and supplements as told by your health care provider. These may include: ? Calcium. Women age 51 and older should get 1,200 mg (milligrams) of calcium every day. ? Vitamin D. Women need 600-800 International Units of vitamin D each day. ? Vitamins B12 and B6. Aim for 50 micrograms of B12 and 1.5 mg of B6 each day. General instructions  Keep track of your menstrual periods, including: ? When they occur. ? How heavy they are and how long they last. ? How much time passes between periods.  Keep track of your symptoms, noting when they start, how often you have them, and how long they last.  Use vaginal lubricants or moisturizers to help with vaginal dryness and improve comfort during sex.  Keep all follow-up visits as told by your health care provider. This is important. This includes any group therapy or counseling. Contact a health care provider if:  You are still having menstrual periods after age 55.  You have pain during sex.  You have not had a period for 12 months and you develop vaginal bleeding. Get help right away if:  You have: ? Severe depression. ? Excessive vaginal bleeding. ? Pain when you urinate. ? A fast or irregular heart beat (palpitations). ? Severe headaches. ? Abdomen (abdominal) pain or severe indigestion.  You fell and you think you have a broken bone.  You develop leg or chest pain.  You develop vision problems.  You feel a lump in your breast. Summary  Menopause is the normal  time of life when menstrual periods stop completely. It is usually confirmed by 12 months without a menstrual period.  The transition to menopause (perimenopause) most often happens between the ages of 45 and 55.  Symptoms can be managed through medicines, lifestyle changes, and complementary therapies such as acupuncture.  Eat a balanced diet that is rich in nutrients to promote bone health and heart health and to manage symptoms during menopause. This information is not intended to replace advice given to you by your health care provider. Make sure you discuss any questions you have with your health care provider. Document Released: 12/29/2003 Document Revised: 09/20/2017 Document Reviewed: 11/10/2016 Elsevier Patient Education  2020 Elsevier Inc.  

## 2019-12-25 ENCOUNTER — Other Ambulatory Visit: Payer: Self-pay | Admitting: Adult Health

## 2020-01-20 ENCOUNTER — Other Ambulatory Visit (HOSPITAL_COMMUNITY): Payer: Self-pay | Admitting: Adult Health

## 2020-01-20 DIAGNOSIS — Z1231 Encounter for screening mammogram for malignant neoplasm of breast: Secondary | ICD-10-CM

## 2020-01-21 ENCOUNTER — Ambulatory Visit (HOSPITAL_COMMUNITY)
Admission: RE | Admit: 2020-01-21 | Discharge: 2020-01-21 | Disposition: A | Discharge status: Home / Self Care | Payer: BLUE CROSS/BLUE SHIELD | Source: Ambulatory Visit | Attending: Adult Health | Admitting: Adult Health

## 2020-01-21 ENCOUNTER — Other Ambulatory Visit: Payer: Self-pay

## 2020-01-21 ENCOUNTER — Other Ambulatory Visit: Payer: Self-pay | Admitting: Adult Health

## 2020-01-21 DIAGNOSIS — Z1231 Encounter for screening mammogram for malignant neoplasm of breast: Secondary | ICD-10-CM | POA: Diagnosis not present

## 2020-01-21 DIAGNOSIS — Z1211 Encounter for screening for malignant neoplasm of colon: Secondary | ICD-10-CM

## 2020-01-21 NOTE — Progress Notes (Signed)
Will refer to Dr Gala Romney for colonoscopy

## 2020-01-25 ENCOUNTER — Encounter: Payer: Self-pay | Admitting: Internal Medicine

## 2020-03-02 ENCOUNTER — Ambulatory Visit: Payer: BLUE CROSS/BLUE SHIELD

## 2020-03-25 ENCOUNTER — Other Ambulatory Visit: Payer: Self-pay | Admitting: *Deleted

## 2020-03-25 MED ORDER — MEGESTROL ACETATE 40 MG PO TABS: ORAL_TABLET | ORAL | 1 refills | Status: DC

## 2020-05-23 ENCOUNTER — Ambulatory Visit (INDEPENDENT_AMBULATORY_CARE_PROVIDER_SITE_OTHER): Payer: Self-pay | Admitting: *Deleted

## 2020-05-23 ENCOUNTER — Other Ambulatory Visit: Payer: Self-pay

## 2020-05-23 VITALS — Ht 65.0 in | Wt 138.6 lb

## 2020-05-23 DIAGNOSIS — Z1211 Encounter for screening for malignant neoplasm of colon: Secondary | ICD-10-CM

## 2020-05-23 MED ORDER — NA SULFATE-K SULFATE-MG SULF 17.5-3.13-1.6 GM/177ML PO SOLN: 1.0000 | Freq: Once | ORAL | 0 refills | Status: AC

## 2020-05-23 NOTE — Patient Instructions (Signed)
Vanessa Beasley  24-May-1966 MRN: [id number]     Procedure Date: 06/20/2020 Time to register: 11:45 am Place to register: [facility] Procedure Time: 1:15 pm Scheduled provider: Dr. Abbey Chatters    PREPARATION FOR COLONOSCOPY WITH SUPREP BOWEL PREP KIT  Note: Suprep Bowel Prep Kit is a split-dose (2day) regimen. Consumption of BOTH 6-ounce bottles is required for a complete prep.  Please notify us immediately if you are diabetic, take iron supplements, or if you are on Coumadin or any other blood thinners.   Please hold the following medications: See letter                                                                                                                                                  2 DAYS BEFORE PROCEDURE:  DATE: 06/18/2020   DAY: Saturday Begin clear liquid diet AFTER your lunch meal. NO SOLID FOODS after this point.  1 DAY BEFORE PROCEDURE:  DATE: 06/19/2020   DAY: Sunday Continue clear liquids the entire day - NO SOLID FOOD.   Diabetic medications adjustments for today: n/a  At 6:00pm: Complete steps 1 through 4 below, using ONE (1) 6-ounce bottle, before going to bed. Step 1:  Pour ONE (1) 6-ounce bottle of SUPREP liquid into the mixing container.  Step 2:  Add cool drinking water to the 16 ounce line on the container and mix.  Note: Dilute the solution concentrate as directed prior to use. Step 3:  DRINK ALL the liquid in the container. Step 4:  You MUST drink an additional two (2) or more 16 ounce containers of water over the next one (1) hour.   Continue clear liquids.  DAY OF PROCEDURE:   DATE: 06/20/2020   DAY: Monday If you take medications for your heart, blood pressure, or breathing, you may take these medications.  Diabetic medications adjustments for today: n/a  5 hours before your procedure at 8:15 am : Step 1:  Pour ONE (1) 6-ounce bottle of SUPREP liquid into the mixing container.  Step 2:  Add cool drinking water to the 16 ounce line on  the container and mix.  Note: Dilute the solution concentrate as directed prior to use. Step 3:  DRINK ALL the liquid in the container. Step 4:  You MUST drink an additional two (2) or more 16 ounce containers of water over the next one (1) hour. You MUST complete the final glass of water at least 3 hours before your colonoscopy. Nothing by mouth past 10:15 am.  You may take your morning medications with sip of water unless we have instructed otherwise.    Please see below for Dietary Information.  CLEAR LIQUIDS INCLUDE:  Water Jello (NOT red in color)   Ice Popsicles (NOT red in color)   Tea (sugar ok, no milk/cream) Powdered fruit flavored drinks  Coffee (sugar  ok, no milk/cream) Gatorade/ Lemonade/ Kool-Aid  (NOT red in color)   Juice: apple, white grape, white cranberry Soft drinks  Clear bullion, consomme, broth (fat free beef/chicken/vegetable)  Carbonated beverages (any kind)  Strained chicken noodle soup Hard Candy   Remember: Clear liquids are liquids that will allow you to see your fingers on the other side of a clear glass. Be sure liquids are NOT red in color, and not cloudy, but CLEAR.  DO NOT EAT OR DRINK ANY OF THE FOLLOWING:  Dairy products of any kind   Cranberry juice Tomato juice / V8 juice   Grapefruit juice Orange juice     Red grape juice  Do not eat any solid foods, including such foods as: cereal, oatmeal, yogurt, fruits, vegetables, creamed soups, eggs, bread, crackers, pureed foods in a blender, etc.   HELPFUL HINTS FOR DRINKING PREP SOLUTION:   Make sure prep is extremely cold. Mix and refrigerate the the morning of the prep. You may also put in the freezer.   You may try mixing some Crystal Light or Country Time Lemonade if you prefer. Mix in small amounts; add more if necessary.  Try drinking through a straw  Rinse mouth with water or a mouthwash between glasses, to remove after-taste.  Try sipping on a cold beverage /ice/ popsicles between glasses  of prep.  Place a piece of sugar-free hard candy in mouth between glasses.  If you become nauseated, try consuming smaller amounts, or stretch out the time between glasses. Stop for 30-60 minutes, then slowly start back drinking.     OTHER INSTRUCTIONS  You will need a responsible adult at least 53 years of age to accompany you and drive you home. This person must remain in the waiting room during your procedure. The hospital will cancel your procedure if you do not have a responsible adult with you.   1. Wear loose fitting clothing that is easily removed. 2. Leave jewelry and other valuables at home.  3. Remove all body piercing jewelry and leave at home. 4. Total time from sign-in until discharge is approximately 2-3 hours. 5. You should go home directly after your procedure and rest. You can resume normal activities the day after your procedure. 6. The day of your procedure you should not:  Drive  Make legal decisions  Operate machinery  Drink alcohol  Return to work   You may call the office (Dept: [phone]) before 5:00pm, or page the doctor on call [phone]) after 5:00pm, for further instructions, if necessary.   Insurance Information YOU WILL NEED TO CHECK WITH YOUR INSURANCE COMPANY FOR THE BENEFITS OF COVERAGE YOU HAVE FOR THIS PROCEDURE.  UNFORTUNATELY, NOT ALL INSURANCE COMPANIES HAVE BENEFITS TO COVER ALL OR PART OF THESE TYPES OF PROCEDURES.  IT IS YOUR RESPONSIBILITY TO CHECK YOUR BENEFITS, HOWEVER, WE WILL BE GLAD TO ASSIST YOU WITH ANY CODES YOUR INSURANCE COMPANY MAY NEED.    PLEASE NOTE THAT MOST INSURANCE COMPANIES WILL NOT COVER A SCREENING COLONOSCOPY FOR PEOPLE UNDER THE AGE OF 50  IF YOU HAVE BC[employer] INSURANCE, YOU MAY HAVE BENEFITS FOR A SCREENING COLONOSCOPY BUT IF POLYPS ARE FOUND THE DIAGNOSIS WILL CHANGE AND THEN YOU MAY HAVE A DEDUCTIBLE THAT WILL NEED TO BE MET. SO PLEASE MAKE SURE YOU CHECK YOUR BENEFITS FOR A SCREENING COLONOSCOPY AS WELL AS  A DIAGNOSTIC COLONOSCOPY.

## 2020-05-23 NOTE — Progress Notes (Signed)
Gastroenterology Pre-Procedure Review  Request Date: 05/23/2020 Requesting Physician: Derrek Monaco, NP @ [facility], no previous TCS  PATIENT REVIEW QUESTIONS: The patient responded to the following health history questions as indicated:    1. Diabetes Melitis: no 2. Joint replacements in the past 12 months: no 3. Major health problems in the past 3 months: no 4. Has an artificial valve or MVP: no 5. Has a defibrillator: no 6. Has been advised in past to take antibiotics in advance of a procedure like teeth cleaning: no 7. Family history of colon cancer: no  8. Alcohol Use: no 9. Illicit drug Use: no 10. History of sleep apnea: no  11. History of coronary artery or other vascular stents placed within the last 12 months: no 12. History of any prior anesthesia complications: no 13. Body mass index is 23.06 kg/m.    MEDICATIONS & ALLERGIES:    Patient reports the following regarding taking any blood thinners:   Plavix? no Aspirin? no Coumadin? no Brilinta? no Xarelto? no Eliquis? no Pradaxa? no Savaysa? no Effient? no  Patient confirms/reports the following medications:  Current Outpatient Medications  Medication Sig Dispense Refill  . BIOTIN PO Take by mouth daily.    . cholecalciferol (VITAMIN D) 1000 units tablet Take 1,000 Units by mouth daily.    Marland Kitchen FLUoxetine (PROZAC) 20 MG capsule Take 1 capsule (20 mg total) by mouth daily. 90 capsule 4  . IRON PO Take by mouth daily.    Marland Kitchen loratadine (CLARITIN) 10 MG tablet Take 10 mg by mouth daily.    . megestrol (MEGACE) 40 MG tablet 3x5d, 2x5d, then 1 daily to stop vaginal bleeding. Stop taking at least 1 week after bleeding stops (Patient taking differently: as needed. 3x5d, 2x5d, then 1 daily to stop vaginal bleeding. Stop taking at least 1 week after bleeding stops) 45 tablet 1  . nitrofurantoin, macrocrystal-monohydrate, (MACROBID) 100 MG capsule TAKE 1 CAPSULE BY MOUTH AS DIRECTED AFTER SEX (Patient taking differently: as  needed. TAKE 1 CAPSULE BY MOUTH AS DIRECTED AFTER SEX) 30 capsule 3  . nystatin-triamcinolone (MYCOLOG II) cream Apply 1 application topically as needed. 30 g 1  . Omega-3 Fatty Acids (FISH OIL PO) Take by mouth daily.    . Multiple Vitamin (MULTIVITAMIN) tablet Take 1 tablet by mouth daily. (Patient not taking: Reported on 05/23/2020)     No current facility-administered medications for this visit.    Patient confirms/reports the following allergies:  No Known Allergies  No orders of the defined types were placed in this encounter.   AUTHORIZATION INFORMATION Primary Insurance: [employer] [place],  ID #: [id number]  Group #:[id number]  Pre-Cert / Auth required: No, not required  SCHEDULE INFORMATION: Procedure has been scheduled as follows:  Date: 06/20/2020, Time: 1:15 Location: [facility] with Dr. Abbey Chatters  This Gastroenterology Pre-Precedure Review Form is being routed to the following provider(s): Roseanne Kaufman, NP

## 2020-05-25 NOTE — Progress Notes (Signed)
Appropriate. ASA 2.  

## 2020-06-09 ENCOUNTER — Encounter (HOSPITAL_COMMUNITY)
Admission: RE | Admit: 2020-06-09 | Discharge: 2020-06-09 | Disposition: A | Discharge status: Home / Self Care | Payer: BLUE CROSS/BLUE SHIELD | Source: Ambulatory Visit | Attending: Internal Medicine | Admitting: Internal Medicine

## 2020-06-09 ENCOUNTER — Other Ambulatory Visit: Payer: Self-pay

## 2020-06-17 ENCOUNTER — Other Ambulatory Visit: Payer: Self-pay

## 2020-06-17 ENCOUNTER — Other Ambulatory Visit (HOSPITAL_COMMUNITY)
Admission: RE | Admit: 2020-06-17 | Discharge: 2020-06-17 | Disposition: A | Discharge status: Home / Self Care | Payer: BLUE CROSS/BLUE SHIELD | Source: Ambulatory Visit | Attending: Internal Medicine | Admitting: Internal Medicine

## 2020-06-17 DIAGNOSIS — Z20822 Contact with and (suspected) exposure to covid-19: Secondary | ICD-10-CM | POA: Insufficient documentation

## 2020-06-17 DIAGNOSIS — Z01812 Encounter for preprocedural laboratory examination: Secondary | ICD-10-CM | POA: Insufficient documentation

## 2020-06-17 LAB — PREGNANCY, URINE: Preg Test, Ur: NEGATIVE

## 2020-06-18 LAB — SARS CORONAVIRUS 2 (TAT 6-24 HRS): SARS Coronavirus 2: NEGATIVE

## 2020-06-20 ENCOUNTER — Ambulatory Visit (HOSPITAL_COMMUNITY): Payer: BLUE CROSS/BLUE SHIELD | Admitting: Anesthesiology

## 2020-06-20 ENCOUNTER — Encounter (HOSPITAL_COMMUNITY): Payer: Self-pay | Admitting: Gastroenterology

## 2020-06-20 ENCOUNTER — Encounter (HOSPITAL_COMMUNITY)
Admission: RE | Disposition: A | Discharge status: Home / Self Care | Payer: Self-pay | Source: Home / Self Care | Attending: Gastroenterology

## 2020-06-20 ENCOUNTER — Other Ambulatory Visit: Payer: Self-pay

## 2020-06-20 ENCOUNTER — Ambulatory Visit (HOSPITAL_COMMUNITY)
Admission: RE | Admit: 2020-06-20 | Discharge: 2020-06-20 | Disposition: A | Discharge status: Home / Self Care | Payer: BLUE CROSS/BLUE SHIELD | Attending: Gastroenterology | Admitting: Gastroenterology

## 2020-06-20 DIAGNOSIS — K5730 Diverticulosis of large intestine without perforation or abscess without bleeding: Secondary | ICD-10-CM | POA: Diagnosis not present

## 2020-06-20 DIAGNOSIS — Z1211 Encounter for screening for malignant neoplasm of colon: Secondary | ICD-10-CM

## 2020-06-20 DIAGNOSIS — Z87891 Personal history of nicotine dependence: Secondary | ICD-10-CM | POA: Insufficient documentation

## 2020-06-20 DIAGNOSIS — D120 Benign neoplasm of cecum: Secondary | ICD-10-CM

## 2020-06-20 DIAGNOSIS — D124 Benign neoplasm of descending colon: Secondary | ICD-10-CM

## 2020-06-20 DIAGNOSIS — Z79899 Other long term (current) drug therapy: Secondary | ICD-10-CM | POA: Diagnosis not present

## 2020-06-20 DIAGNOSIS — K635 Polyp of colon: Secondary | ICD-10-CM | POA: Diagnosis not present

## 2020-06-20 HISTORY — PX: POLYPECTOMY: SHX5525

## 2020-06-20 HISTORY — PX: COLONOSCOPY WITH PROPOFOL: SHX5780

## 2020-06-20 SURGERY — COLONOSCOPY WITH PROPOFOL
Anesthesia: General

## 2020-06-20 MED ORDER — LACTATED RINGERS IV SOLN: Freq: Once | INTRAVENOUS | Status: AC

## 2020-06-20 MED ORDER — LACTATED RINGERS IV SOLN: INTRAVENOUS | Status: DC | PRN

## 2020-06-20 MED ORDER — PROPOFOL 10 MG/ML IV BOLUS
INTRAVENOUS | Status: DC | PRN
  Administered 2020-06-20: 100 mg via INTRAVENOUS

## 2020-06-20 MED ORDER — STERILE WATER FOR IRRIGATION IR SOLN
Status: DC | PRN
  Administered 2020-06-20: 1.5 mL

## 2020-06-20 MED ORDER — PROPOFOL 10 MG/ML IV BOLUS
INTRAVENOUS | Status: AC
  Filled 2020-06-20: qty 40

## 2020-06-20 MED ORDER — PROPOFOL 500 MG/50ML IV EMUL
INTRAVENOUS | Status: DC | PRN
  Administered 2020-06-20: 150 ug/kg/min via INTRAVENOUS

## 2020-06-20 NOTE — Anesthesia Preprocedure Evaluation (Addendum)
Anesthesia Evaluation  Patient identified by MRN, date of birth, ID band Patient awake    Reviewed: Allergy & Precautions, NPO status , Patient's Chart, lab work & pertinent test results  History of Anesthesia Complications Negative for: history of anesthetic complications  Airway Mallampati: II  TM Distance: >3 FB Neck ROM: Full    Dental  (+) Dental Advisory Given   Pulmonary former smoker,    Pulmonary exam normal breath sounds clear to auscultation       Cardiovascular Exercise Tolerance: Good Normal cardiovascular exam Rhythm:Regular Rate:Normal     Neuro/Psych negative neurological ROS  negative psych ROS   GI/Hepatic negative GI ROS, Neg liver ROS,   Endo/Other  negative endocrine ROS  Renal/GU negative Renal ROS  negative genitourinary   Musculoskeletal negative musculoskeletal ROS (+)   Abdominal   Peds negative pediatric ROS (+)  Hematology  (+) anemia ,   Anesthesia Other Findings   Reproductive/Obstetrics negative OB ROS                           Anesthesia Physical Anesthesia Plan  ASA: II  Anesthesia Plan: General   Post-op Pain Management:    Induction: Intravenous  PONV Risk Score and Plan: TIVA  Airway Management Planned: Nasal Cannula and Natural Airway  Additional Equipment:   Intra-op Plan:   Post-operative Plan:   Informed Consent: I have reviewed the patients History and Physical, chart, labs and discussed the procedure including the risks, benefits and alternatives for the proposed anesthesia with the patient or authorized representative who has indicated his/her understanding and acceptance.     Dental advisory given  Plan Discussed with: CRNA and Surgeon  Anesthesia Plan Comments:         Anesthesia Quick Evaluation

## 2020-06-20 NOTE — Discharge Instructions (Signed)
You are being discharged to home.  Resume your previous diet.  Your physician has recommended a repeat colonoscopy for surveillance based on pathology results.        Colonoscopy, Adult, Care After This sheet gives you information about how to care for yourself after your procedure. Your doctor may also give you more specific instructions. If you have problems or questions, call your doctor. What can I expect after the procedure? After the procedure, it is common to have:  A small amount of blood in your poop (stool) for 24 hours.  Some gas.  Mild cramping or bloating in your belly (abdomen). Follow these instructions at home: Eating and drinking   Drink enough fluid to keep your pee (urine) pale yellow.  Follow instructions from your doctor about what you cannot eat or drink.  Return to your normal diet as told by your doctor. Avoid heavy or fried foods that are hard to digest. Activity  Rest as told by your doctor.  Do not sit for a long time without moving. Get up to take short walks every 1-2 hours. This is important. Ask for help if you feel weak or unsteady.  Return to your normal activities as told by your doctor. Ask your doctor what activities are safe for you. To help cramping and bloating:   Try walking around.  Put heat on your belly as told by your doctor. Use the heat source that your doctor recommends, such as a moist heat pack or a heating pad. ? Put a towel between your skin and the heat source. ? Leave the heat on for 20-30 minutes. ? Remove the heat if your skin turns bright red. This is very important if you are unable to feel pain, heat, or cold. You may have a greater risk of getting burned. General instructions  For the first 24 hours after the procedure: ? Do not drive or use machinery. ? Do not sign important documents. ? Do not drink alcohol. ? Do your daily activities more slowly than normal. ? Eat foods that are soft and easy to  digest.  Take over-the-counter or prescription medicines only as told by your doctor.  Keep all follow-up visits as told by your doctor. This is important. Contact a doctor if:  You have blood in your poop 2-3 days after the procedure. Get help right away if:  You have more than a small amount of blood in your poop.  You see large clumps of tissue (blood clots) in your poop.  Your belly is swollen.  You feel like you may vomit (nauseous).  You vomit.  You have a fever.  You have belly pain that gets worse, and medicine does not help your pain. Summary  After the procedure, it is common to have a small amount of blood in your poop. You may also have mild cramping and bloating in your belly.  For the first 24 hours after the procedure, do not drive or use machinery, do not sign important documents, and do not drink alcohol.  Get help right away if you have a lot of blood in your poop, feel like you may vomit, have a fever, or have more belly pain. This information is not intended to replace advice given to you by your health care provider. Make sure you discuss any questions you have with your health care provider. Document Revised: 05/04/2019 Document Reviewed: 05/04/2019 Elsevier Patient Education  [place].     High-Fiber Diet Fiber, also  called dietary fiber, is a type of carbohydrate that is found in fruits, vegetables, whole grains, and beans. A high-fiber diet can have many health benefits. Your health care provider may recommend a high-fiber diet to help:  Prevent constipation. Fiber can make your bowel movements more regular.  Lower your cholesterol.  Relieve the following conditions: ? Swelling of veins in the anus (hemorrhoids). ? Swelling and irritation (inflammation) of specific areas of the digestive tract (uncomplicated diverticulosis). ? A problem of the large intestine (colon) that sometimes causes pain and diarrhea (irritable bowel syndrome,  IBS).  Prevent overeating as part of a weight-loss plan.  Prevent heart disease, type 2 diabetes, and certain cancers. What is my plan? The recommended daily fiber intake in grams (g) includes:  38 g for men age 39 or younger.  30 g for men over age 53.  7 g for women age 26 or younger.  21 g for women over age 56. You can get the recommended daily intake of dietary fiber by:  Eating a variety of fruits, vegetables, grains, and beans.  Taking a fiber supplement, if it is not possible to get enough fiber through your diet. What do I need to know about a high-fiber diet?  It is better to get fiber through food sources rather than from fiber supplements. There is not a lot of research about how effective supplements are.  Always check the fiber content on the nutrition facts label of any prepackaged food. Look for foods that contain 5 g of fiber or more per serving.  Talk with a diet and nutrition specialist (dietitian) if you have questions about specific foods that are recommended or not recommended for your medical condition, especially if those foods are not listed below.  Gradually increase how much fiber you consume. If you increase your intake of dietary fiber too quickly, you may have bloating, cramping, or gas.  Drink plenty of water. Water helps you to digest fiber. What are tips for following this plan?  Eat a wide variety of high-fiber foods.  Make sure that half of the grains that you eat each day are whole grains.  Eat breads and cereals that are made with whole-grain flour instead of refined flour or white flour.  Eat brown rice, bulgur wheat, or millet instead of white rice.  Start the day with a breakfast that is high in fiber, such as a cereal that contains 5 g of fiber or more per serving.  Use beans in place of meat in soups, salads, and pasta dishes.  Eat high-fiber snacks, such as berries, raw vegetables, nuts, and popcorn.  Choose whole fruits and  vegetables instead of processed forms like juice or sauce. What foods can I eat?  Fruits Berries. Pears. Apples. Oranges. Avocado. Prunes and raisins. Dried figs. Vegetables Sweet potatoes. Spinach. Kale. Artichokes. Cabbage. Broccoli. Cauliflower. Green peas. Carrots. Squash. Grains Whole-grain breads. Multigrain cereal. Oats and oatmeal. Brown rice. Barley. Bulgur wheat. [place]. Quinoa. Bran muffins. Popcorn. Rye wafer crackers. Meats and other proteins Navy, kidney, and pinto beans. Soybeans. Split peas. Lentils. Nuts and seeds. Dairy Fiber-fortified yogurt. Beverages Fiber-fortified soy milk. Fiber-fortified orange juice. Other foods Fiber bars. The items listed above may not be a complete list of recommended foods and beverages. Contact a dietitian for more options. What foods are not recommended? Fruits Fruit juice. Cooked, strained fruit. Vegetables Fried potatoes. Canned vegetables. Well-cooked vegetables. Grains White bread. Pasta made with refined flour. White rice. Meats and other proteins Fatty cuts  of meat. Fried chicken or fried fish. Dairy Milk. Yogurt. Cream cheese. Sour cream. Fats and oils Butters. Beverages Soft drinks. Other foods Cakes and pastries. The items listed above may not be a complete list of foods and beverages to avoid. Contact a dietitian for more information. Summary  Fiber is a type of carbohydrate. It is found in fruits, vegetables, whole grains, and beans.  There are many health benefits of eating a high-fiber diet, such as preventing constipation, lowering blood cholesterol, helping with weight loss, and reducing your risk of heart disease, diabetes, and certain cancers.  Gradually increase your intake of fiber. Increasing too fast can result in cramping, bloating, and gas. Drink plenty of water while you increase your fiber.  The best sources of fiber include whole fruits and vegetables, whole grains, nuts, seeds, and beans. This  information is not intended to replace advice given to you by your health care provider. Make sure you discuss any questions you have with your health care provider. Document Revised: 08/12/2017 Document Reviewed: 08/12/2017 Elsevier Patient Education  [place].     Diverticulosis  Diverticulosis is a condition that develops when small pouches (diverticula) form in the wall of the large intestine (colon). The colon is where water is absorbed and stool (feces) is formed. The pouches form when the inside layer of the colon pushes through weak spots in the outer layers of the colon. You may have a few pouches or many of them. The pouches usually do not cause problems unless they become inflamed or infected. When this happens, the condition is called diverticulitis. What are the causes? The cause of this condition is not known. What increases the risk? The following factors may make you more likely to develop this condition:  Being older than age 29. Your risk for this condition increases with age. Diverticulosis is rare among people younger than age 84. By age 28, many people have it.  Eating a low-fiber diet.  Having frequent constipation.  Being overweight.  Not getting enough exercise.  Smoking.  Taking over-the-counter pain medicines, like aspirin and ibuprofen.  Having a family history of diverticulosis. What are the signs or symptoms? In most people, there are no symptoms of this condition. If you do have symptoms, they may include:  Bloating.  Cramps in the abdomen.  Constipation or diarrhea.  Pain in the lower left side of the abdomen. How is this diagnosed? Because diverticulosis usually has no symptoms, it is most often diagnosed during an exam for other colon problems. The condition may be diagnosed by:  Using a flexible scope to examine the colon (colonoscopy).  Taking an X-ray of the colon after dye has been put into the colon (barium  enema).  Having a CT scan. How is this treated? You may not need treatment for this condition. Your health care provider may recommend treatment to prevent problems. You may need treatment if you have symptoms or if you previously had diverticulitis. Treatment may include:  Eating a high-fiber diet.  Taking a fiber supplement.  Taking a live bacteria supplement (probiotic).  Taking medicine to relax your colon. Follow these instructions at home: Medicines  Take over-the-counter and prescription medicines only as told by your health care provider.  If told by your health care provider, take a fiber supplement or probiotic. Constipation prevention Your condition may cause constipation. To prevent or treat constipation, you may need to:  Drink enough fluid to keep your urine pale yellow.  Take over-the-counter or  prescription medicines.  Eat foods that are high in fiber, such as beans, whole grains, and fresh fruits and vegetables.  Limit foods that are high in fat and processed sugars, such as fried or sweet foods.  General instructions  Try not to strain when you have a bowel movement.  Keep all follow-up visits as told by your health care provider. This is important. Contact a health care provider if you:  Have pain in your abdomen.  Have bloating.  Have cramps.  Have not had a bowel movement in 3 days. Get help right away if:  Your pain gets worse.  Your bloating becomes very bad.  You have a fever or chills, and your symptoms suddenly get worse.  You vomit.  You have bowel movements that are bloody or black.  You have bleeding from your rectum. Summary  Diverticulosis is a condition that develops when small pouches (diverticula) form in the wall of the large intestine (colon).  You may have a few pouches or many of them.  This condition is most often diagnosed during an exam for other colon problems.  Treatment may include increasing the fiber in  your diet, taking supplements, or taking medicines. This information is not intended to replace advice given to you by your health care provider. Make sure you discuss any questions you have with your health care provider. Document Revised: 05/07/2019 Document Reviewed: 05/07/2019 Elsevier Patient Education  [place] After These instructions provide you with information about caring for yourself after your procedure. Your health care provider may also give you more specific instructions. Your treatment has been planned according to current medical practices, but problems sometimes occur. Call your health care provider if you have any problems or questions after your procedure. What can I expect after the procedure? After your procedure, you may:  Feel sleepy for several hours.  Feel clumsy and have poor balance for several hours.  Feel forgetful about what happened after the procedure.  Have poor judgment for several hours.  Feel nauseous or vomit.  Have a sore throat if you had a breathing tube during the procedure. Follow these instructions at home: For at least 24 hours after the procedure:      Have a responsible adult stay with you. It is important to have someone help care for you until you are awake and alert.  Rest as needed.  Do not: ? Participate in activities in which you could fall or become injured. ? Drive. ? Use heavy machinery. ? Drink alcohol. ? Take sleeping pills or medicines that cause drowsiness. ? Make important decisions or sign legal documents. ? Take care of children on your own. Eating and drinking  Follow the diet that is recommended by your health care provider.  If you vomit, drink water, juice, or soup when you can drink without vomiting.  Make sure you have little or no nausea before eating solid foods. General instructions  Take over-the-counter and prescription medicines only as told by your  health care provider.  If you have sleep apnea, surgery and certain medicines can increase your risk for breathing problems. Follow instructions from your health care provider about wearing your sleep device: ? Anytime you are sleeping, including during daytime naps. ? While taking prescription pain medicines, sleeping medicines, or medicines that make you drowsy.  If you smoke, do not smoke without supervision.  Keep all follow-up visits as told by your health care provider. This is  important. Contact a health care provider if:  You keep feeling nauseous or you keep vomiting.  You feel light-headed.  You develop a rash.  You have a fever. Get help right away if:  You have trouble breathing. Summary  For several hours after your procedure, you may feel sleepy and have poor judgment.  Have a responsible adult stay with you for at least 24 hours or until you are awake and alert. This information is not intended to replace advice given to you by your health care provider. Make sure you discuss any questions you have with your health care provider. Document Revised: 01/06/2018 Document Reviewed: 01/29/2016 Elsevier Patient Education  [place].

## 2020-06-20 NOTE — Transfer of Care (Signed)
Immediate Anesthesia Transfer of Care Note  Patient: Vanessa Beasley  Procedure(s) Performed: COLONOSCOPY WITH PROPOFOL (N/A ) POLYPECTOMY  Patient Location: PACU  Anesthesia Type:General  Level of Consciousness: awake, alert , oriented and patient cooperative  Airway & Oxygen Therapy: Patient Spontanous Breathing  Post-op Assessment: Report given to RN, Post -op Vital signs reviewed and stable and Patient moving all extremities X 4  Post vital signs: Reviewed and stable  Last Vitals:  Vitals Value Taken Time  BP    Temp    Pulse    Resp    SpO2      Last Pain:  Vitals:   06/20/20 1311  TempSrc:   PainSc: 0-No pain      Patients Stated Pain Goal: 6 (59/[id number]/96 [id number])  Complications: No complications documented.

## 2020-06-20 NOTE — H&P (Signed)
Vanessa Beasley is an 54 y.o. female.   Chief Complaint: Screening colonoscopy HPI: 54 year old female with past medical history of anemia, who comes to the hospital to undergo screening colonoscopy.  The patient denies having any complaints at this moment, has not presented any melena, hematochezia, abdominal pain or distention, nausea, vomiting, unintentional weight loss.  The patient has never had a colonoscopy in the past.  No family history of colorectal cancer.  Past Medical History:  Diagnosis Date  . Anemia 06/23/2014  . Fatigue 06/01/2013  . Menorrhagia 06/23/2014  . PMS (premenstrual syndrome) 06/01/2013  . Vaginal discharge 06/01/2013    Past Surgical History:  Procedure Laterality Date  . CESAREAN SECTION    . TONSILLECTOMY AND ADENOIDECTOMY    . tubes in ears      Family History  Problem Relation Age of Onset  . Cancer Father        lung   . Diabetes Paternal Grandmother   . Cancer Paternal Grandmother        colon   Social History:  reports that she has quit smoking. Her smoking use included cigarettes. She quit after 5.00 years of use. She has never used smokeless tobacco. She reports that she does not drink alcohol and does not use drugs.  Allergies: No Known Allergies  Medications Prior to Admission  Medication Sig Dispense Refill  . BIOTIN PO Take 1 tablet by mouth daily.     . cholecalciferol (VITAMIN D) 1000 units tablet Take 1,000 Units by mouth daily.    Marland Kitchen FLUoxetine (PROZAC) 20 MG capsule Take 1 capsule (20 mg total) by mouth daily. 90 capsule 4  . IRON PO Take 1 tablet by mouth daily.     Marland Kitchen loratadine (CLARITIN) 10 MG tablet Take 10 mg by mouth daily.    . nitrofurantoin, macrocrystal-monohydrate, (MACROBID) 100 MG capsule TAKE 1 CAPSULE BY MOUTH AS DIRECTED AFTER SEX (Patient taking differently: Take 100 mg by mouth daily as needed (UTI Prevention). ) 30 capsule 3  . Omega-3 Fatty Acids (FISH OIL PO) Take 1 capsule by mouth daily.     . megestrol (MEGACE) 40 MG  tablet 3x5d, 2x5d, then 1 daily to stop vaginal bleeding. Stop taking at least 1 week after bleeding stops (Patient not taking: Reported on 05/31/2020) 45 tablet 1  . Multiple Vitamin (MULTIVITAMIN) tablet Take 1 tablet by mouth daily. (Patient not taking: Reported on 05/23/2020)    . nystatin-triamcinolone (MYCOLOG II) cream Apply 1 application topically as needed. (Patient not taking: Reported on 05/31/2020) 30 g 1    No results found for this or any previous visit (from the past 48 hour(s)). No results found.  Review of Systems  Constitutional: Negative.   HENT: Negative.   Eyes: Negative.   Respiratory: Negative.   Cardiovascular: Negative.   Gastrointestinal: Negative.   Endocrine: Negative.   Genitourinary: Negative.   Musculoskeletal: Negative.   Skin: Negative.   Allergic/Immunologic: Negative.   Neurological: Negative.   Hematological: Negative.   Psychiatric/Behavioral: Negative.  Negative for agitation.    Blood pressure 103/75, pulse 73, temperature 98.2 F (36.8 C), temperature source Oral, resp. rate 19, height 5\' 5"  (1.651 m), weight 62.6 kg, SpO2 100 %. Physical Exam  GENERAL: The patient is AO x3, in no acute distress. HEENT: Head is normocephalic and atraumatic. EOMI are intact. Mouth is well hydrated and without lesions. NECK: Supple. No masses LUNGS: Clear to auscultation. No presence of rhonchi/wheezing/rales. Adequate chest expansion HEART: RRR, normal s1 and s2.  ABDOMEN: Soft, nontender, no guarding, no peritoneal signs, and nondistended. BS +. No masses. EXTREMITIES: Without any cyanosis, clubbing, rash, lesions or edema. NEUROLOGIC: AOx3, no focal motor deficit. SKIN: no jaundice, no rashes  Assessment/Plan 54 year old female with past medical history of anemia, who comes to the hospital to undergo screening colonoscopy.  The patient is at average risk for colorectal cancer.  We will proceed with colonoscopy today.  Harvel Quale,  MD 06/20/2020, 1:04 PM

## 2020-06-20 NOTE — Op Note (Addendum)
[facility] Patient Name: Vanessa Beasley Procedure Date: 06/20/2020 12:22 PM MRN: [id number] Date of Birth: 02/27/1966 Attending MD: Maylon Peppers ,  CSN: [id number] Age: 54 Admit Type: Outpatient Procedure:                Colonoscopy Indications:              Screening for colorectal malignant neoplasm Providers:                Maylon Peppers, Charlsie Quest. Theda Sers RN, RN, Caprice Kluver, Casimer Bilis, Technician, Aram Candela Referring MD:              Medicines:                Monitored Anesthesia Care Complications:            No immediate complications. Estimated Blood Loss:     Estimated blood loss: none. Procedure:                Pre-Anesthesia Assessment:                           - Prior to the procedure, a History and Physical                            was performed, and patient medications, allergies                            and sensitivities were reviewed. The patient's                            tolerance of previous anesthesia was reviewed.                           - The risks and benefits of the procedure and the                            sedation options and risks were discussed with the                            patient. All questions were answered and informed                            consent was obtained.                           - ASA Grade Assessment: I - A normal, healthy                            patient.                           After obtaining informed consent, the colonoscope                            was passed under direct vision. Throughout  the                            procedure, the patient's blood pressure, pulse, and                            oxygen saturations were monitored continuously. The                            [id number]-[id number] ([id number]) scope was introduced through                            the anus and advanced to the the cecum, identified                            by appendiceal orifice and  ileocecal valve. The                            colonoscopy was performed without difficulty. The                            patient tolerated the procedure well. The quality                            of the bowel preparation was excellent. Scope                            withdrawal time was 9 minutes. Scope In: 1:15:28 PM Scope Out: 1:33:29 PM Scope Withdrawal Time: 0 hours 11 minutes 14 seconds  Total Procedure Duration: 0 hours 18 minutes 1 second  Findings:      The perianal and digital rectal examinations were normal.      Two sessile polyps were found in the descending colon and cecum. The       polyps were 1 to 2 mm in size. These polyps were removed with a cold       biopsy forceps. Resection and retrieval were complete.      The retroflexed view of the distal rectum and anal verge was normal and       showed no anal or rectal abnormalities.      A few small-mouthed diverticula were found in the sigmoid colon. Impression:               - Two 1 to 2 mm polyps in the descending colon and                            in the cecum, removed with a cold biopsy forceps.                            Resected and retrieved.                           - The distal rectum and anal verge are normal on                            retroflexion  view.                           - Diverticulosis in the sigmoid colon. Moderate Sedation:      Per Anesthesia Care Recommendation:           - Discharge patient to home (ambulatory).                           - Resume previous diet.                           - Repeat colonoscopy for surveillance based on                            pathology results. Procedure Code(s):        --- Professional ---                           [phone], GC, Colonoscopy, flexible; with biopsy,                            single or multiple Diagnosis Code(s):        --- Professional ---                           Z12.11, Encounter for screening for malignant                             neoplasm of colon                           K63.5, Polyp of colon                           K57.30, Diverticulosis of large intestine without                            perforation or abscess without bleeding CPT copyright 2019 American Medical Association. All rights reserved. The codes documented in this report are preliminary and upon coder review may  be revised to meet current compliance requirements. Maylon Peppers, MD Maylon Peppers,  06/20/2020 1:42:13 PM This report has been signed electronically. Number of Addenda: 0

## 2020-06-20 NOTE — Anesthesia Postprocedure Evaluation (Signed)
Anesthesia Post Note  Patient: Vanessa Beasley  Procedure(s) Performed: COLONOSCOPY WITH PROPOFOL (N/A ) POLYPECTOMY  Patient location during evaluation: Phase II Anesthesia Type: General Level of consciousness: awake, oriented, awake and alert and patient cooperative Pain management: satisfactory to patient Vital Signs Assessment: post-procedure vital signs reviewed and stable Respiratory status: spontaneous breathing, respiratory function stable and nonlabored ventilation Cardiovascular status: stable Postop Assessment: no apparent nausea or vomiting Anesthetic complications: no   No complications documented.   Last Vitals:  Vitals:   06/20/20 1156  BP: 103/75  Pulse: 73  Resp: 19  Temp: 36.8 C  SpO2: 100%    Last Pain:  Vitals:   06/20/20 1311  TempSrc:   PainSc: 0-No pain                 Rameen Quinney

## 2020-06-22 LAB — SURGICAL PATHOLOGY

## 2020-06-23 ENCOUNTER — Encounter (HOSPITAL_COMMUNITY): Payer: Self-pay | Admitting: Gastroenterology

## 2020-09-28 ENCOUNTER — Other Ambulatory Visit: Payer: BLUE CROSS/BLUE SHIELD | Admitting: Adult Health

## 2020-10-27 ENCOUNTER — Other Ambulatory Visit: Payer: BLUE CROSS/BLUE SHIELD | Admitting: Adult Health

## 2021-05-23 ENCOUNTER — Other Ambulatory Visit (HOSPITAL_COMMUNITY): Payer: Self-pay | Admitting: Adult Health

## 2021-05-23 DIAGNOSIS — Z1231 Encounter for screening mammogram for malignant neoplasm of breast: Secondary | ICD-10-CM

## 2021-05-24 ENCOUNTER — Other Ambulatory Visit: Payer: Self-pay

## 2021-05-24 ENCOUNTER — Ambulatory Visit (HOSPITAL_COMMUNITY)
Admission: RE | Admit: 2021-05-24 | Discharge: 2021-05-24 | Disposition: A | Discharge status: Home / Self Care | Payer: BLUE CROSS/BLUE SHIELD | Source: Ambulatory Visit | Attending: Adult Health | Admitting: Adult Health

## 2021-05-24 DIAGNOSIS — Z1231 Encounter for screening mammogram for malignant neoplasm of breast: Secondary | ICD-10-CM | POA: Insufficient documentation

## 2021-07-06 ENCOUNTER — Other Ambulatory Visit (HOSPITAL_COMMUNITY)
Admission: RE | Admit: 2021-07-06 | Discharge: 2021-07-06 | Disposition: A | Discharge status: Home / Self Care | Payer: BLUE CROSS/BLUE SHIELD | Source: Ambulatory Visit | Attending: Adult Health | Admitting: Adult Health

## 2021-07-06 ENCOUNTER — Encounter: Payer: Self-pay | Admitting: Adult Health

## 2021-07-06 ENCOUNTER — Other Ambulatory Visit: Payer: Self-pay

## 2021-07-06 ENCOUNTER — Ambulatory Visit (INDEPENDENT_AMBULATORY_CARE_PROVIDER_SITE_OTHER): Payer: BLUE CROSS/BLUE SHIELD | Admitting: Adult Health

## 2021-07-06 VITALS — BP 113/77 | HR 68 | Ht 64.0 in | Wt 141.5 lb

## 2021-07-06 DIAGNOSIS — E782 Mixed hyperlipidemia: Secondary | ICD-10-CM

## 2021-07-06 DIAGNOSIS — N951 Menopausal and female climacteric states: Secondary | ICD-10-CM

## 2021-07-06 DIAGNOSIS — N926 Irregular menstruation, unspecified: Secondary | ICD-10-CM

## 2021-07-06 DIAGNOSIS — Z01419 Encounter for gynecological examination (general) (routine) without abnormal findings: Secondary | ICD-10-CM | POA: Diagnosis not present

## 2021-07-06 DIAGNOSIS — Z1211 Encounter for screening for malignant neoplasm of colon: Secondary | ICD-10-CM

## 2021-07-06 LAB — HEMOCCULT GUIAC POC 1CARD (OFFICE): Fecal Occult Blood, POC: NEGATIVE

## 2021-07-06 MED ORDER — NITROFURANTOIN MONOHYD MACRO 100 MG PO CAPS: ORAL_CAPSULE | ORAL | 3 refills | Status: DC

## 2021-07-06 MED ORDER — NYSTATIN-TRIAMCINOLONE 100000-0.1 UNIT/GM-% EX CREA: 1.0000 "application " | TOPICAL_CREAM | CUTANEOUS | 1 refills | Status: AC | PRN

## 2021-07-06 NOTE — Progress Notes (Signed)
Patient ID: Jaala Nute, female   DOB: 1965/11/26, 55 y.o.   MRN: [id number] History of Present Illness: Genavieve is a 55 year old white female,married, G1P1 in for a well woman gyn exam and pap. Having some night sweats and skipping periods. She requests fasting labs.  Current Medications, Allergies, Past Medical History, Past Surgical History, Family History and Social History were reviewed in [employer] record.     Review of Systems:  Patient denies any headaches, hearing loss, fatigue, blurred vision, shortness of breath, chest pain, abdominal pain, problems with bowel movements, urination, or intercourse. No joint pain or mood swings.  See HPI for positives   Physical Exam:BP 113/77 (BP Location: Left Arm, Patient Position: Sitting, Cuff Size: Normal)   Pulse 68   Ht '5\' 4"'$  (1.626 m)   Wt 141 lb 8 oz (64.2 kg)   LMP 05/11/2021   BMI 24.29 kg/m   General:  Well developed, well nourished, no acute distress Skin:  Warm and dry Neck:  Midline trachea, normal thyroid, good ROM, no lymphadenopathy Lungs; Clear to auscultation bilaterally Breast:  No dominant palpable mass, retraction, or nipple discharge Cardiovascular: Regular rate and rhythm Abdomen:  Soft, non tender, no hepatosplenomegaly Pelvic:  External genitalia is normal in appearance, no lesions.  The vagina is normal in appearance.pink blood. Urethra has no lesions or masses. The cervix is bulbous.Pap with HR HPV genotyping performed.  Uterus is felt to be normal size, shape, and contour.  No adnexal masses or tenderness noted.Bladder is non tender, no masses felt. Rectal: Good sphincter tone, no polyps, or hemorrhoids felt.  Hemoccult negative. Extremities/musculoskeletal:  No swelling or varicosities noted, no clubbing or cyanosis Psych:  No mood changes, alert and cooperative,seems happy AA is 0  Fall risk is low Depression screen [facility] 2/9 07/06/2021 09/24/2019 07/10/2018  Decreased Interest 0 0 0   Down, Depressed, Hopeless 0 0 0  PHQ - 2 Score 0 0 0  Altered sleeping 1 - -  Tired, decreased energy 2 - -  Change in appetite 1 - -  Feeling bad or failure about yourself  0 - -  Trouble concentrating 1 - -  Suicidal thoughts 0 - -  PHQ-9 Score 5 - -    GAD 7 : Generalized Anxiety Score 07/06/2021  Nervous, Anxious, on Edge 1  Control/stop worrying 1  Worry too much - different things 1  Trouble relaxing 1  Restless 1  Easily annoyed or irritable 1  Afraid - awful might happen 1  Total GAD 7 Score 7      Upstream - 07/06/21 1029       Pregnancy Intention Screening   Does the patient want to become pregnant in the next year? No    Does the patient's partner want to become pregnant in the next year? No    Would the patient like to discuss contraceptive options today? No      Contraception Wrap Up   Current Method Vasectomy    End Method Vasectomy    Contraception Counseling Provided No              Upstream - 07/06/21 1029       Pregnancy Intention Screening   Does the patient want to become pregnant in the next year? No    Does the patient's partner want to become pregnant in the next year? No    Would the patient like to discuss contraceptive options today? No  Contraception Wrap Up   Current Method Vasectomy    End Method Vasectomy    Contraception Counseling Provided No            Examination chaperoned by Levy Pupa LPN   Impression and plan: 1. Encounter for gynecological examination with Papanicolaou smear of cervix Pap sent Physical in 1 year Pap in 3 if normal Mammogram yearly  Will check labs today Colonoscopy per GI - Cytology - PAP( [place]) - CBC - Comprehensive metabolic panel - TSH Will refill Macrobid to take after sex and Mycolog to use prn  Meds ordered this encounter  Medications   nystatin-triamcinolone (MYCOLOG II) cream    Sig: Apply 1 application topically as needed.    Dispense:  30 g    Refill:  1     Order Specific Question:   Supervising Provider    Answer:   Elonda Husky, LUTHER H [[id number]]   nitrofurantoin, macrocrystal-monohydrate, (MACROBID) 100 MG capsule    Sig: TAKE 1 CAPSULE BY MOUTH AS DIRECTED AFTER SEX    Dispense:  30 capsule    Refill:  3    Order Specific Question:   Supervising Provider    Answer:   Elonda Husky, LUTHER H [[id number]]    2. Elevated cholesterol with elevated triglycerides - Lipid panel  3. Irregular periods   4. Encounter for screening fecal occult blood testing - POCT occult blood stool  5. Perimenopause

## 2021-07-07 LAB — LIPID PANEL
Chol/HDL Ratio: 3.9 ratio (ref 0.0–4.4)
Cholesterol, Total: 209 mg/dL — ABNORMAL HIGH (ref 100–199)
HDL: 54 mg/dL (ref >39)
LDL Chol Calc (NIH): 127 mg/dL — ABNORMAL HIGH (ref 0–99)
Triglycerides: 159 mg/dL — ABNORMAL HIGH (ref 0–149)
VLDL Cholesterol Cal: 28 mg/dL (ref 5–40)

## 2021-07-07 LAB — COMPREHENSIVE METABOLIC PANEL
ALT: 14 IU/L (ref 0–32)
AST: 17 IU/L (ref 0–40)
Albumin/Globulin Ratio: 1.6 (ref 1.2–2.2)
Albumin: 4.7 g/dL (ref 3.8–4.9)
Alkaline Phosphatase: 82 IU/L (ref 44–121)
BUN/Creatinine Ratio: 11 (ref 9–23)
BUN: 8 mg/dL (ref 6–24)
Bilirubin Total: 0.3 mg/dL (ref 0.0–1.2)
CO2: 24 mmol/L (ref 20–29)
Calcium: 9.4 mg/dL (ref 8.7–10.2)
Chloride: 103 mmol/L (ref 96–106)
Creatinine, Ser: 0.75 mg/dL (ref 0.57–1.00)
Globulin, Total: 2.9 g/dL (ref 1.5–4.5)
Glucose: 92 mg/dL (ref 65–99)
Potassium: 4.5 mmol/L (ref 3.5–5.2)
Sodium: 139 mmol/L (ref 134–144)
Total Protein: 7.6 g/dL (ref 6.0–8.5)
eGFR: 95 mL/min/{1.73_m2} (ref >59)

## 2021-07-07 LAB — CBC
Hematocrit: 34.3 % (ref 34.0–46.6)
Hemoglobin: 10.4 g/dL — ABNORMAL LOW (ref 11.1–15.9)
MCH: 23.9 pg — ABNORMAL LOW (ref 26.6–33.0)
MCHC: 30.3 g/dL — ABNORMAL LOW (ref 31.5–35.7)
MCV: 79 fL (ref 79–97)
Platelets: 285 10*3/uL (ref 150–450)
RBC: 4.36 x10E6/uL (ref 3.77–5.28)
RDW: 15.4 % (ref 11.7–15.4)
WBC: 6.9 10*3/uL (ref 3.4–10.8)

## 2021-07-07 LAB — TSH: TSH: 3.78 u[IU]/mL (ref 0.450–4.500)

## 2021-07-11 LAB — CYTOLOGY - PAP
Comment: NEGATIVE
Diagnosis: NEGATIVE
High risk HPV: NEGATIVE

## 2022-06-07 ENCOUNTER — Encounter: Payer: Self-pay | Admitting: Adult Health

## 2022-07-12 ENCOUNTER — Encounter: Payer: Self-pay | Admitting: Adult Health

## 2022-07-12 ENCOUNTER — Ambulatory Visit (INDEPENDENT_AMBULATORY_CARE_PROVIDER_SITE_OTHER): Payer: 59 | Admitting: Adult Health

## 2022-07-12 VITALS — BP 113/75 | HR 74 | Ht 64.0 in | Wt 145.0 lb

## 2022-07-12 DIAGNOSIS — N926 Irregular menstruation, unspecified: Secondary | ICD-10-CM | POA: Diagnosis not present

## 2022-07-12 DIAGNOSIS — Z1211 Encounter for screening for malignant neoplasm of colon: Secondary | ICD-10-CM | POA: Diagnosis not present

## 2022-07-12 DIAGNOSIS — F419 Anxiety disorder, unspecified: Secondary | ICD-10-CM

## 2022-07-12 DIAGNOSIS — R61 Generalized hyperhidrosis: Secondary | ICD-10-CM

## 2022-07-12 DIAGNOSIS — F32A Depression, unspecified: Secondary | ICD-10-CM | POA: Insufficient documentation

## 2022-07-12 DIAGNOSIS — Z01419 Encounter for gynecological examination (general) (routine) without abnormal findings: Secondary | ICD-10-CM | POA: Diagnosis not present

## 2022-07-12 DIAGNOSIS — N951 Menopausal and female climacteric states: Secondary | ICD-10-CM

## 2022-07-12 LAB — HEMOCCULT GUIAC POC 1CARD (OFFICE): Fecal Occult Blood, POC: NEGATIVE

## 2022-07-12 MED ORDER — FLUOXETINE HCL 10 MG PO CAPS: 10.0000 mg | ORAL_CAPSULE | Freq: Every day | ORAL | 3 refills | Status: DC

## 2022-07-12 MED ORDER — NITROFURANTOIN MONOHYD MACRO 100 MG PO CAPS: ORAL_CAPSULE | ORAL | 3 refills | Status: AC

## 2022-07-12 NOTE — Progress Notes (Signed)
Patient ID: Vanessa Beasley, female   DOB: 30-Oct-1965, 56 y.o.   MRN: [id number] History of Present Illness: Vanessa Beasley is a 56 year old white female, married, G1P1 in for a well woman gyn exam.  Last pap was 07/06/21 and negative for malignancy and HPV.  PCP is Dr Shanon Brow In [place].  Current Medications, Allergies, Past Medical History, Past Surgical History, Family History and Social History were reviewed in [employer] record.     Review of Systems: Patient denies any headaches, hearing loss,  blurred vision, shortness of breath, chest pain, abdominal pain, problems with bowel movements, urination, or intercourse. No joint pain or mood swings.  +tired, not sleeping well +night sweats +irregular periods Overwhelmed at work, has aging parents   Physical Exam:BP 113/75 (BP Location: Left Arm, Patient Position: Sitting, Cuff Size: Normal)   Pulse 74   Ht '5\' 4"'$  (1.626 m)   Wt 145 lb (65.8 kg)   LMP 07/12/2022   BMI 24.89 kg/m   General:  Well developed, well nourished, no acute distress Skin:  Warm and dry Neck:  Midline trachea, normal thyroid, good ROM, no lymphadenopathy Lungs; Clear to auscultation bilaterally Breast:  No dominant palpable mass, retraction, or nipple discharge Cardiovascular: Regular rate and rhythm Abdomen:  Soft, non tender, no hepatosplenomegaly Pelvic:  External genitalia is normal in appearance, no lesions.  The vagina is normal in appearance,+light period blood. Urethra has no lesions or masses. The cervix is smooth.  Uterus is felt to be normal size, shape, and contour.  No adnexal masses or tenderness noted.Bladder is non tender, no masses felt. Rectal: Good sphincter tone, no polyps, or hemorrhoids felt.  Hemoccult negative. Extremities/musculoskeletal:  No swelling or varicosities noted, no clubbing or cyanosis Psych:  No mood changes, alert and cooperative,seems happy AA is 0 Fall risk is low    07/12/2022    8:33 AM 07/06/2021    10:33 AM 09/24/2019    9:38 AM  Depression screen PHQ 2/9  Decreased Interest 1 0 0  Down, Depressed, Hopeless 2 0 0  PHQ - 2 Score 3 0 0  Altered sleeping 3 1   Tired, decreased energy 3 2   Change in appetite 1 1   Feeling bad or failure about yourself  3 0   Trouble concentrating 3 1   Moving slowly or fidgety/restless 0    Suicidal thoughts 0 0   PHQ-9 Score 16 5        07/12/2022    8:33 AM 07/06/2021   10:34 AM  GAD 7 : Generalized Anxiety Score  Nervous, Anxious, on Edge 3 1  Control/stop worrying 3 1  Worry too much - different things 3 1  Trouble relaxing 3 1  Restless 2 1  Easily annoyed or irritable 3 1  Afraid - awful might happen 3 1  Total GAD 7 Score 20 7      Upstream - 07/12/22 0841       Pregnancy Intention Screening   Does the patient want to become pregnant in the next year? No    Does the patient's partner want to become pregnant in the next year? No    Would the patient like to discuss contraceptive options today? No      Contraception Wrap Up   Current Method Vasectomy    End Method Vasectomy    Contraception Counseling Provided No             Examination chaperoned by Levy Pupa LPN  Impression and Plan: 1. Encounter for well woman exam with routine gynecological exam Pap in 2025 Physical in 1 year Labs with PCP M[facility]ogram was [facility]/07/[facility]  Colonoscopy per GI Will refill Macrobid to take after sex to prevent UTI   2. Encounter for screening fecal occult blood testing Hemoccult was negative   3. Perimenopause  4. Irregular periods Will skip periods, but still having   5. Night sweats +night sweats, will wake up and hard to go back to sleep Try going another place to sleep, no electronics   6. Anxiety and depression +overwhelmed, a little teary today Has taken Prozac in the past, will rx Prozac 10 mg 1 daily Take time for self, and try to say no sometimes Meds ordered this encounter  Medications    FLUoxetine (PROZAC) 10 MG capsule    Sig: Take 1 capsule (10 mg total) by mouth daily.    Dispense:  30 capsule    Refill:  3    Order Specific Question:   Supervising Provider    Answer:   Elonda Husky, LUTHER H [[id number]]   nitrofurantoin, macrocrystal-monohydrate, (MACROBID) 100 MG capsule    Sig: TAKE 1 CAPSULE BY MOUTH AS DIRECTED AFTER SEX    Dispense:  30 capsule    Refill:  3    Order Specific Question:   Supervising Provider    Answer:   Tania Ade H [[id number]]    Follow up in 8 weeks for ROS

## 2022-09-06 ENCOUNTER — Ambulatory Visit: Payer: 59 | Admitting: Adult Health

## 2023-01-28 IMAGING — MG MM DIGITAL SCREENING BILAT W/ TOMO AND CAD
8 series · 9 of 24 positions shown · non-contrast
Comparison: Previous exam(s).

CLINICAL DATA: Screening.

EXAM:
DIGITAL SCREENING BILATERAL MAMMOGRAM WITH TOMOSYNTHESIS AND CAD
TECHNIQUE: Bilateral screening digital craniocaudal and mediolateral oblique
mammograms were obtained. Bilateral screening digital breast
tomosynthesis was performed. The images were evaluated with
computer-aided detection.

[L MLO synth-2D]
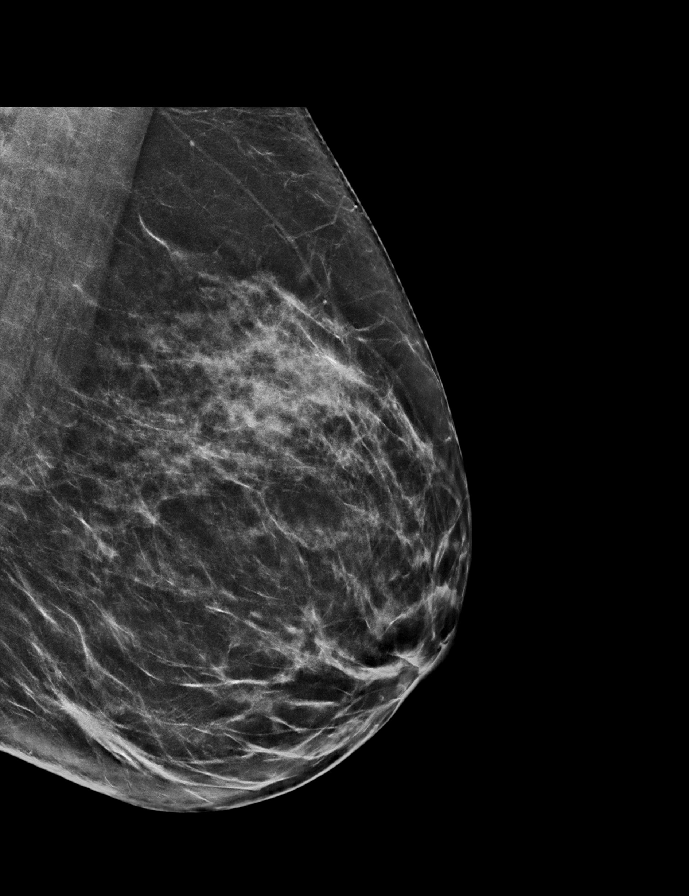

[R CC synth-2D]
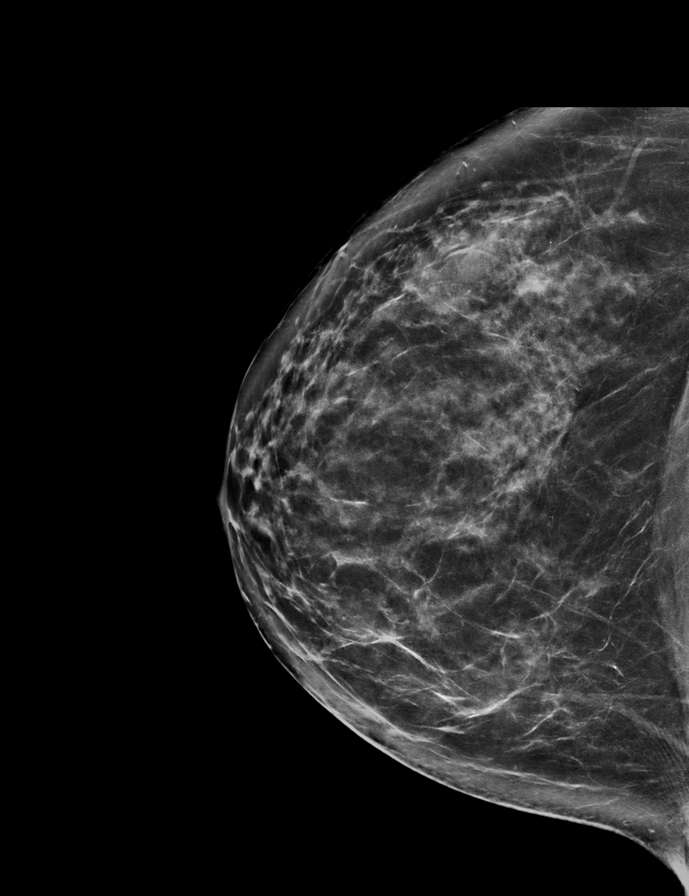

[L CC synth-2D]
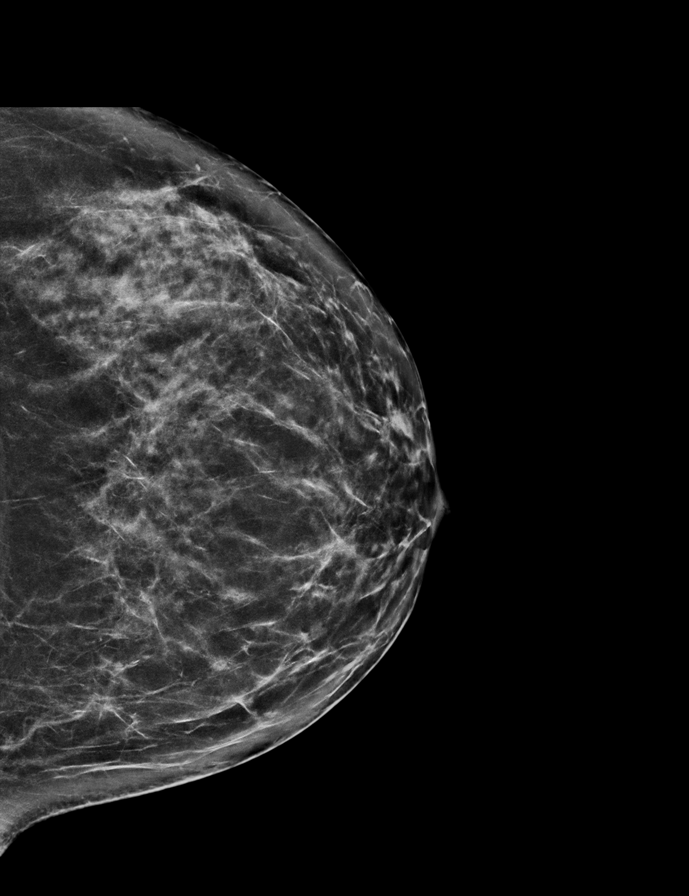

[R MLO synth-2D]
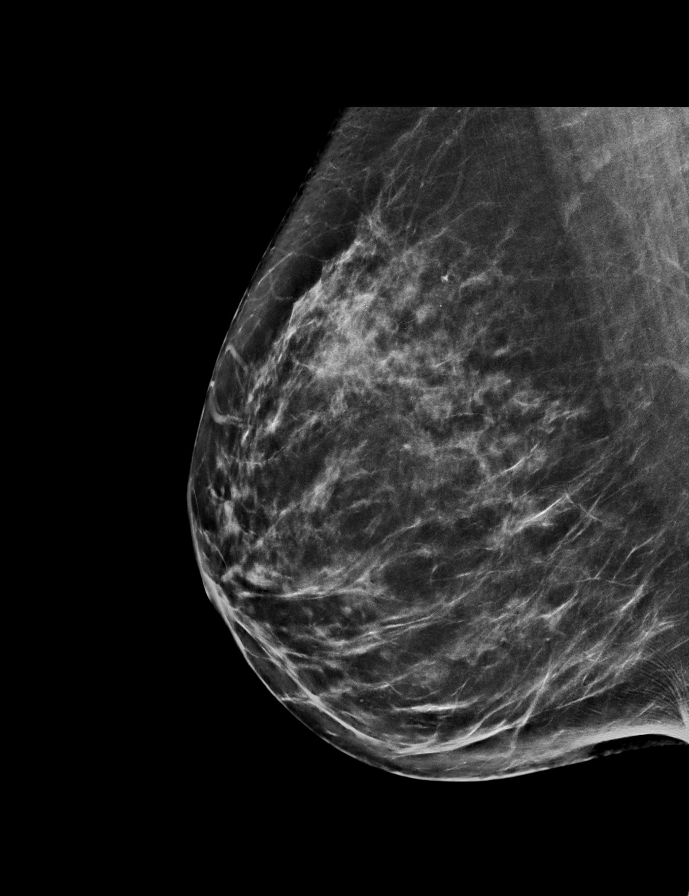

[L MLO tomo · 2 of 72 frames shown]
[frame 24/72]
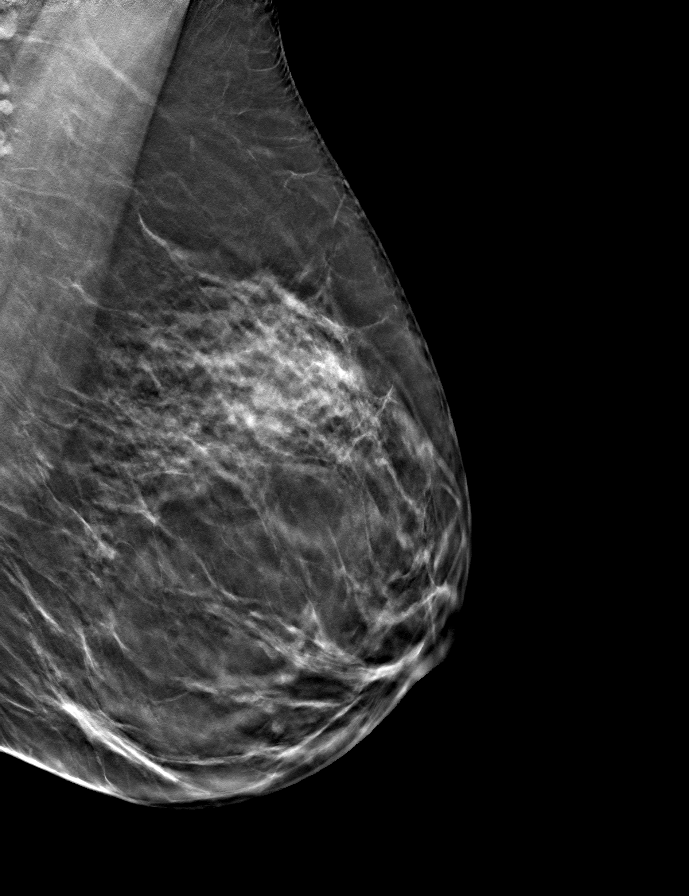
[frame 37/72]
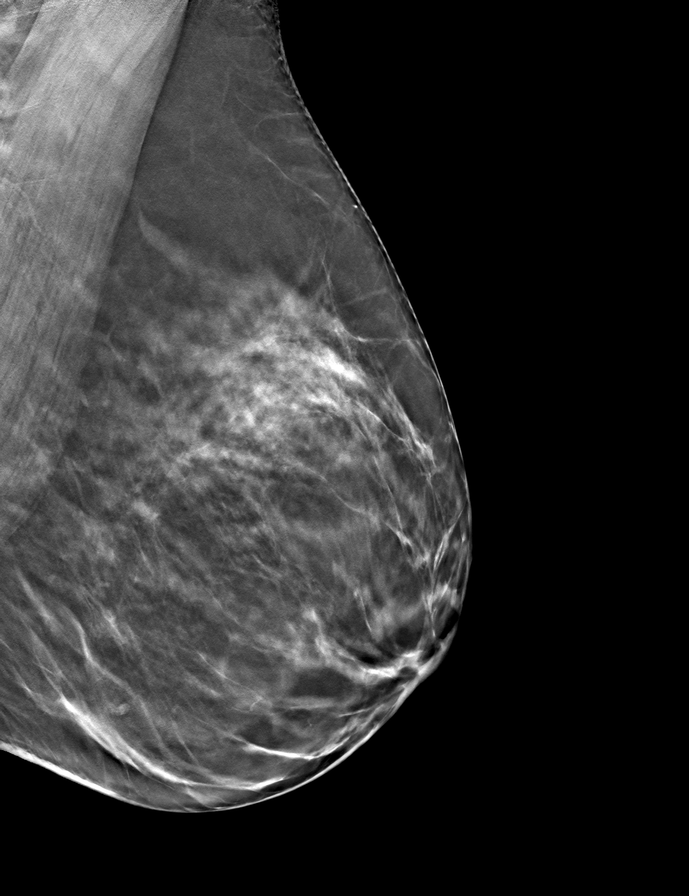

[L CC tomo · tomo slice 35/69.0]
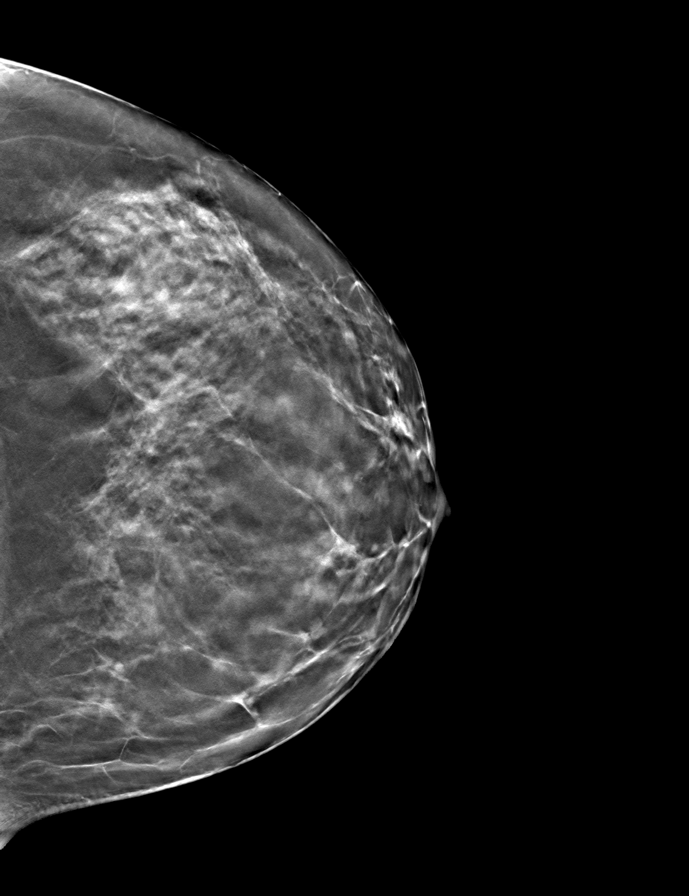

[R MLO tomo · tomo slice 39/78.0]
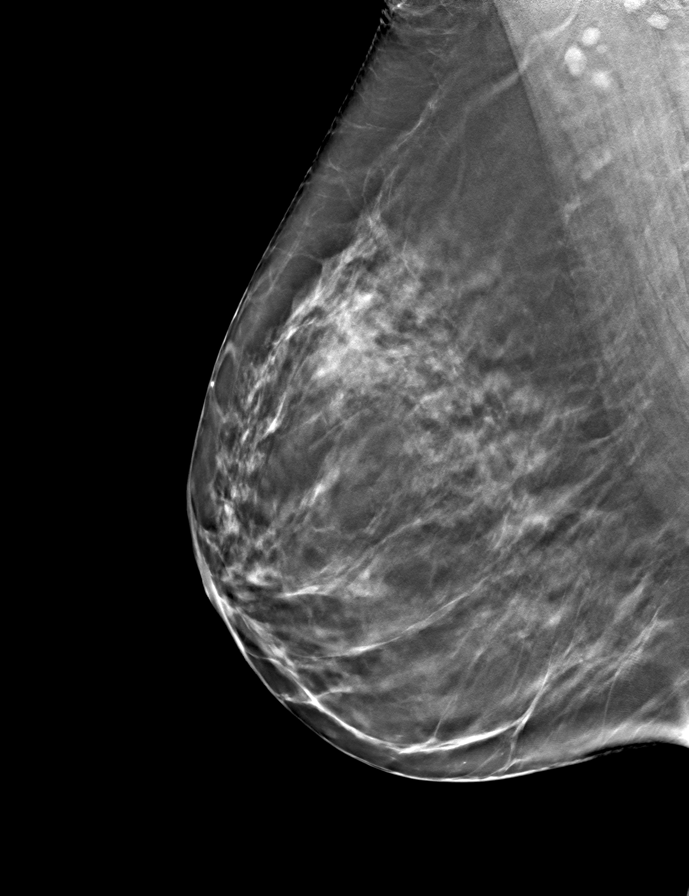

[R CC tomo · tomo slice 39/76.0]
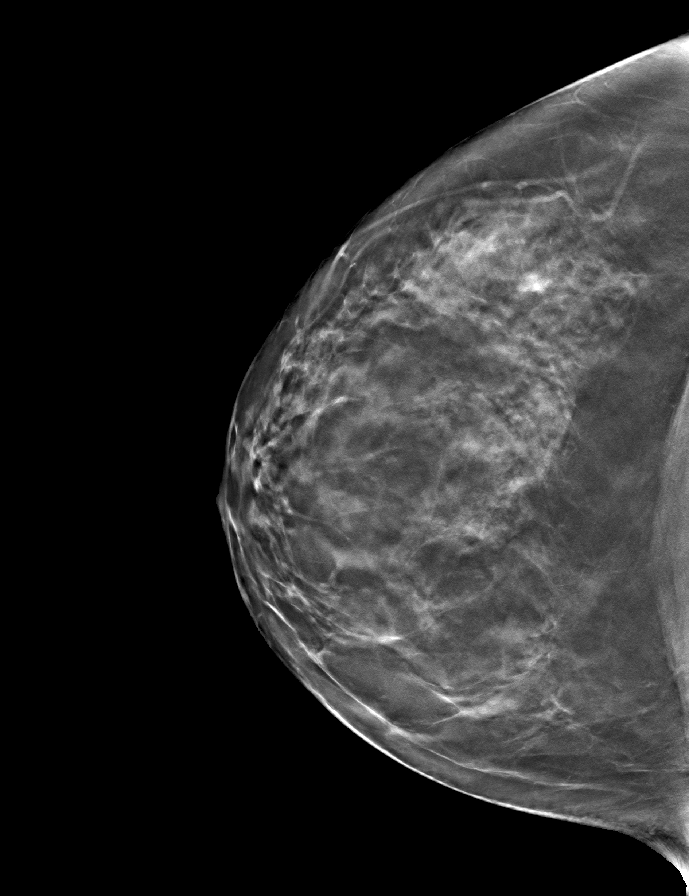

[9 of 24 positions shown; findings below may reference images not displayed]

ACR Breast Density Category c: The breast tissue is heterogeneously
dense, which may obscure small masses.
FINDINGS: There are no findings suspicious for malignancy.
IMPRESSION: No mammographic evidence of malignancy. A result letter of this
screening mammogram will be mailed directly to the patient.

RECOMMENDATION:
Screening mammogram in one year. (Code:Q3-[id number])

BI-RADS CATEGORY  1: Negative.

## 2024-01-27 ENCOUNTER — Ambulatory Visit: Payer: 59 | Admitting: Adult Health

## 2024-03-20 ENCOUNTER — Ambulatory Visit: Admitting: Adult Health

## 2024-03-20 ENCOUNTER — Other Ambulatory Visit (HOSPITAL_COMMUNITY)
Admission: RE | Admit: 2024-03-20 | Discharge: 2024-03-20 | Disposition: A | Discharge status: Home / Self Care | Source: Ambulatory Visit | Attending: Adult Health | Admitting: Adult Health

## 2024-03-20 ENCOUNTER — Encounter: Payer: Self-pay | Admitting: Adult Health

## 2024-03-20 VITALS — BP 110/76 | HR 77 | Ht 65.0 in | Wt 141.0 lb

## 2024-03-20 DIAGNOSIS — Z1331 Encounter for screening for depression: Secondary | ICD-10-CM | POA: Diagnosis not present

## 2024-03-20 DIAGNOSIS — F32A Depression, unspecified: Secondary | ICD-10-CM

## 2024-03-20 DIAGNOSIS — Z1211 Encounter for screening for malignant neoplasm of colon: Secondary | ICD-10-CM

## 2024-03-20 DIAGNOSIS — N926 Irregular menstruation, unspecified: Secondary | ICD-10-CM | POA: Diagnosis not present

## 2024-03-20 DIAGNOSIS — N951 Menopausal and female climacteric states: Secondary | ICD-10-CM | POA: Diagnosis not present

## 2024-03-20 DIAGNOSIS — Z13220 Encounter for screening for lipoid disorders: Secondary | ICD-10-CM

## 2024-03-20 DIAGNOSIS — Z1329 Encounter for screening for other suspected endocrine disorder: Secondary | ICD-10-CM

## 2024-03-20 DIAGNOSIS — R61 Generalized hyperhidrosis: Secondary | ICD-10-CM | POA: Diagnosis not present

## 2024-03-20 DIAGNOSIS — Z01419 Encounter for gynecological examination (general) (routine) without abnormal findings: Secondary | ICD-10-CM | POA: Insufficient documentation

## 2024-03-20 DIAGNOSIS — F419 Anxiety disorder, unspecified: Secondary | ICD-10-CM

## 2024-03-20 LAB — HEMOCCULT GUIAC POC 1CARD (OFFICE): Fecal Occult Blood, POC: NEGATIVE

## 2024-03-20 NOTE — Progress Notes (Signed)
 Patient ID: Vanessa Beasley, female   DOB: August 29, 1966, 58 y.o.   MRN: [id number] History of Present Illness: Vanessa Beasley is a 58 year old white female, married, G1P1 in for a well woman gyn exam and pap.  She is skipping periods now, having hot flashes, and night sweats and not sleeping well. Life is stressful,husband had MI, parents aging and children moving away, and job.   PCP is Dr Myrtie Atkinson in [place], has seen twice now.  Current Medications, Allergies, Past Medical History, Past Surgical History, Family History and Social History were reviewed in [employer] record.     Review of Systems: Patient denies any headaches, hearing loss, fatigue, blurred vision, shortness of breath, chest pain, abdominal pain, problems with bowel movements, urination, or intercourse. No joint pain or mood swings.  See HPI for positives    Physical Exam:BP 110/76 (BP Location: Left Arm, Patient Position: Sitting, Cuff Size: Normal)   Pulse 77   Ht 5\' 5"  (1.651 m)   Wt 141 lb (64 kg)   LMP 12/09/2023 (Approximate)   BMI 23.46 kg/m   General:  Well developed, well nourished, no acute distress Skin:  Warm and dry Neck:  Midline trachea, normal thyroid , good ROM, no lymphadenopathy Lungs; Clear to auscultation bilaterally Breast:  No dominant palpable mass, retraction, or nipple discharge Cardiovascular: Regular rate and rhythm Abdomen:  Soft, non tender, no hepatosplenomegaly Pelvic:  External genitalia is normal in appearance, no lesions.  The vagina is normal in appearance. Urethra has no lesions or masses. The cervix is smooth, pap with HR HPV genotyping performed.  Uterus is felt to be normal size, shape, and contour.  No adnexal masses or tenderness noted.Bladder is non tender, no masses felt. Rectal: Good sphincter tone, no polyps, or hemorrhoids felt.  Hemoccult negative. Extremities/musculoskeletal:  No swelling or varicosities noted, no clubbing or cyanosis Psych:  No mood changes,  alert and cooperative,seems happy AA is 0 Fall risk is low    03/20/2024    8:34 AM 07/12/2022    8:33 AM 07/06/2021   10:33 AM  Depression screen PHQ 2/9  Decreased Interest 2 1 0  Down, Depressed, Hopeless 2 2 0  PHQ - 2 Score 4 3 0  Altered sleeping 2 3 1   Tired, decreased energy 2 3 2   Change in appetite 2 1 1   Feeling bad or failure about yourself  2 3 0  Trouble concentrating 2 3 1   Moving slowly or fidgety/restless 0 0   Suicidal thoughts 1 0 0  PHQ-9 Score 15 16 5        03/20/2024    8:34 AM 07/12/2022    8:33 AM 07/06/2021   10:34 AM  GAD 7 : Generalized Anxiety Score  Nervous, Anxious, on Edge 3 3 1   Control/stop worrying 2 3 1   Worry too much - different things 2 3 1   Trouble relaxing 2 3 1   Restless 1 2 1   Easily annoyed or irritable 2 3 1   Afraid - awful might happen 2 3 1   Total GAD 7 Score 14 20 7     Upstream - 03/20/24 0840       Pregnancy Intention Screening   Does the patient want to become pregnant in the next year? No    Does the patient's partner want to become pregnant in the next year? No    Would the patient like to discuss contraceptive options today? No      Contraception Wrap Up   Current  Method Vasectomy    End Method Vasectomy    Contraception Counseling Provided No            Examination chaperoned by Alphonso Aschoff LPN    Impression and Plan: 1. Encounter for gynecological examination with Papanicolaou smear of cervix (Primary) Pap sent Pap in 3 years if normal Physical in 1 year Fasting labs today M[facility]ogram was [facility] 06/05/22 at [facility], get one this year  - Cytology - PAP( [place]) - CBC - Comprehensive metabolic panel with GFR - Lipid panel Colonoscopy per GI Stay active   2. Irregular periods Skipping periods now Will check labs - TSH + free T4 - Follicle stimulating hormone  3. Night sweats - Follicle stimulating hormone Try 10 mg of melatonin at night to see if helps with sleep  4.  Perimenopause   5. Anxiety and depression She declines meds   6. Encounter for screening fecal occult blood testing Hemoccult is negative  - POCT occult blood stool  7. Screening cholesterol level - Lipid panel  8. Screening for thyroid  disorder - TSH + free T4

## 2024-03-21 LAB — COMPREHENSIVE METABOLIC PANEL WITH GFR
ALT: 30 IU/L (ref 0–32)
AST: 26 IU/L (ref 0–40)
Albumin: 4.4 g/dL (ref 3.8–4.9)
Alkaline Phosphatase: 111 IU/L (ref 44–121)
BUN/Creatinine Ratio: 14 (ref 9–23)
BUN: 13 mg/dL (ref 6–24)
Bilirubin Total: 0.5 mg/dL (ref 0.0–1.2)
CO2: 21 mmol/L (ref 20–29)
Calcium: 9.5 mg/dL (ref 8.7–10.2)
Chloride: 103 mmol/L (ref 96–106)
Creatinine, Ser: 0.96 mg/dL (ref 0.57–1.00)
Globulin, Total: 3.2 g/dL (ref 1.5–4.5)
Glucose: 84 mg/dL (ref 70–99)
Potassium: 4.1 mmol/L (ref 3.5–5.2)
Sodium: 141 mmol/L (ref 134–144)
Total Protein: 7.6 g/dL (ref 6.0–8.5)
eGFR: 69 mL/min/{1.73_m2} (ref >59)

## 2024-03-21 LAB — TSH+FREE T4
Free T4: 1.09 ng/dL (ref 0.82–1.77)
TSH: 2.96 u[IU]/mL (ref 0.450–4.500)

## 2024-03-21 LAB — LIPID PANEL
Chol/HDL Ratio: 4.2 ratio (ref 0.0–4.4)
Cholesterol, Total: 241 mg/dL — ABNORMAL HIGH (ref 100–199)
HDL: 57 mg/dL (ref >39)
LDL Chol Calc (NIH): 148 mg/dL — ABNORMAL HIGH (ref 0–99)
Triglycerides: 201 mg/dL — ABNORMAL HIGH (ref 0–149)
VLDL Cholesterol Cal: 36 mg/dL (ref 5–40)

## 2024-03-21 LAB — CBC
Hematocrit: 42.7 % (ref 34.0–46.6)
Hemoglobin: 14 g/dL (ref 11.1–15.9)
MCH: 30.2 pg (ref 26.6–33.0)
MCHC: 32.8 g/dL (ref 31.5–35.7)
MCV: 92 fL (ref 79–97)
Platelets: 239 10*3/uL (ref 150–450)
RBC: 4.64 x10E6/uL (ref 3.77–5.28)
RDW: 13 % (ref 11.7–15.4)
WBC: 7 10*3/uL (ref 3.4–10.8)

## 2024-03-21 LAB — FOLLICLE STIMULATING HORMONE: FSH: 47.3 m[IU]/mL

## 2024-03-24 ENCOUNTER — Ambulatory Visit: Payer: Self-pay | Admitting: Adult Health

## 2024-03-25 LAB — CYTOLOGY - PAP
Comment: NEGATIVE
Diagnosis: NEGATIVE
Diagnosis: REACTIVE
High risk HPV: NEGATIVE
